# Patient Record
Sex: Male | Born: 1975 | Race: Black or African American | Hispanic: No | Smoking: Never smoker
Health system: Southern US, Community
[De-identification: ages and names within clinical notes are randomized; demographics above are authoritative.]

## PROBLEM LIST (undated history)

## (undated) DIAGNOSIS — F419 Anxiety disorder, unspecified: Secondary | ICD-10-CM

---

## 2008-10-15 ENCOUNTER — Emergency Department (HOSPITAL_COMMUNITY): Admission: EM | Admit: 2008-10-15 | Discharge: 2008-10-15 | Payer: Self-pay | Admitting: Emergency Medicine

## 2009-01-28 ENCOUNTER — Emergency Department (HOSPITAL_COMMUNITY): Admission: EM | Admit: 2009-01-28 | Discharge: 2009-01-28 | Payer: Self-pay | Admitting: Emergency Medicine

## 2009-03-09 ENCOUNTER — Emergency Department (HOSPITAL_COMMUNITY): Admission: EM | Admit: 2009-03-09 | Discharge: 2009-03-09 | Payer: Self-pay | Admitting: Emergency Medicine

## 2009-04-16 ENCOUNTER — Emergency Department (HOSPITAL_COMMUNITY): Admission: EM | Admit: 2009-04-16 | Discharge: 2009-04-16 | Payer: Self-pay | Admitting: Family Medicine

## 2009-08-02 ENCOUNTER — Emergency Department (HOSPITAL_COMMUNITY): Admission: EM | Admit: 2009-08-02 | Discharge: 2009-08-02 | Payer: Self-pay | Admitting: Emergency Medicine

## 2009-10-22 ENCOUNTER — Emergency Department (HOSPITAL_COMMUNITY): Admission: EM | Admit: 2009-10-22 | Discharge: 2009-10-22 | Payer: Self-pay | Admitting: Emergency Medicine

## 2011-03-03 LAB — COMPREHENSIVE METABOLIC PANEL
Albumin: 4.2 g/dL (ref 3.5–5.2)
Alkaline Phosphatase: 61 U/L (ref 39–117)
BUN: 9 mg/dL (ref 6–23)
CO2: 26 mEq/L (ref 19–32)
Chloride: 103 mEq/L (ref 96–112)
Creatinine, Ser: 1.22 mg/dL (ref 0.4–1.5)
GFR calc non Af Amer: >60 mL/min (ref >60)
Glucose, Bld: 81 mg/dL (ref 70–99)
Potassium: 4.6 mEq/L (ref 3.5–5.1)
Total Bilirubin: 0.7 mg/dL (ref 0.3–1.2)

## 2011-03-03 LAB — DIFFERENTIAL
Basophils Absolute: 0 10*3/uL (ref 0.0–0.1)
Basophils Relative: 1 % (ref 0–1)
Monocytes Absolute: 0.3 10*3/uL (ref 0.1–1.0)
Neutro Abs: 1.7 10*3/uL (ref 1.7–7.7)

## 2011-03-03 LAB — URINALYSIS, ROUTINE W REFLEX MICROSCOPIC
Bilirubin Urine: NEGATIVE
Hgb urine dipstick: NEGATIVE
Nitrite: NEGATIVE
Protein, ur: NEGATIVE mg/dL
Urobilinogen, UA: 1 mg/dL (ref 0.0–1.0)

## 2011-03-03 LAB — CBC
HCT: 49.4 % (ref 39.0–52.0)
Hemoglobin: 16.4 g/dL (ref 13.0–17.0)
MCV: 89.2 fL (ref 78.0–100.0)
RBC: 5.54 MIL/uL (ref 4.22–5.81)
WBC: 3.9 10*3/uL — ABNORMAL LOW (ref 4.0–10.5)

## 2011-03-03 LAB — URINE CULTURE

## 2011-03-03 LAB — LIPASE, BLOOD: Lipase: 23 U/L (ref 11–59)

## 2014-01-27 HISTORY — PX: BACK SURGERY: SHX140

## 2015-03-22 ENCOUNTER — Emergency Department (INDEPENDENT_AMBULATORY_CARE_PROVIDER_SITE_OTHER)
Admission: EM | Admit: 2015-03-22 | Discharge: 2015-03-22 | Disposition: A | Discharge status: Nursing Home (Includes ICF) | Payer: Self-pay | Source: Home / Self Care

## 2015-03-22 ENCOUNTER — Emergency Department (HOSPITAL_COMMUNITY): Payer: Self-pay

## 2015-03-22 ENCOUNTER — Emergency Department (HOSPITAL_COMMUNITY)
Admission: EM | Admit: 2015-03-22 | Discharge: 2015-03-22 | Discharge status: Against Medical Advice | Payer: Self-pay | Attending: Emergency Medicine | Admitting: Emergency Medicine

## 2015-03-22 ENCOUNTER — Emergency Department (INDEPENDENT_AMBULATORY_CARE_PROVIDER_SITE_OTHER): Payer: Self-pay

## 2015-03-22 ENCOUNTER — Encounter (HOSPITAL_COMMUNITY): Payer: Self-pay

## 2015-03-22 ENCOUNTER — Encounter (HOSPITAL_COMMUNITY): Payer: Self-pay | Admitting: Emergency Medicine

## 2015-03-22 DIAGNOSIS — S8992XA Unspecified injury of left lower leg, initial encounter: Secondary | ICD-10-CM | POA: Insufficient documentation

## 2015-03-22 DIAGNOSIS — Y9389 Activity, other specified: Secondary | ICD-10-CM | POA: Insufficient documentation

## 2015-03-22 DIAGNOSIS — M25562 Pain in left knee: Secondary | ICD-10-CM

## 2015-03-22 DIAGNOSIS — Y998 Other external cause status: Secondary | ICD-10-CM | POA: Insufficient documentation

## 2015-03-22 DIAGNOSIS — W01198A Fall on same level from slipping, tripping and stumbling with subsequent striking against other object, initial encounter: Secondary | ICD-10-CM | POA: Insufficient documentation

## 2015-03-22 DIAGNOSIS — R600 Localized edema: Secondary | ICD-10-CM

## 2015-03-22 DIAGNOSIS — M79609 Pain in unspecified limb: Secondary | ICD-10-CM

## 2015-03-22 DIAGNOSIS — M7989 Other specified soft tissue disorders: Secondary | ICD-10-CM

## 2015-03-22 DIAGNOSIS — Y9289 Other specified places as the place of occurrence of the external cause: Secondary | ICD-10-CM | POA: Insufficient documentation

## 2015-03-22 DIAGNOSIS — Z880 Allergy status to penicillin: Secondary | ICD-10-CM | POA: Insufficient documentation

## 2015-03-22 MED ORDER — HYDROCODONE-ACETAMINOPHEN 5-325 MG PO TABS
ORAL_TABLET | ORAL | Status: AC
  Filled 2015-03-22: qty 2

## 2015-03-22 MED ORDER — HYDROCODONE-ACETAMINOPHEN 5-325 MG PO TABS
2.0000 | ORAL_TABLET | Freq: Once | ORAL | Status: AC
  Administered 2015-03-22: 2 via ORAL

## 2015-03-22 NOTE — ED Notes (Signed)
Pt is able to ambulate well, with steady gait.

## 2015-03-22 NOTE — ED Provider Notes (Signed)
CSN: [id number]     Arrival date & time 03/22/15  1132 History   None    Chief Complaint  Patient presents with  . Back Pain  . Knee Injury   (Consider location/radiation/quality/duration/timing/severity/associated sxs/prior Treatment)  HPI   Patient is a 39 year old male presenting today with complaints of left knee and back pain. Patient states he had a lumbar laminectomy with stabilization in October of last year in [place]. Patient states he is here visiting his children who live here. Patient states he fell about 2 weeks ago landing on his left side left knee. Patient states he has been "stubborn" and seeking treatment. Patient arrived today ambulating poorly with the assistance of crutches. She reports positive numbness and tingling from the left knee down to the foot in a sock distribution pattern. Reports significant left lower leg weakness and significant left knee pain. States has not taken any medication recently for pain and has used an ace wrap on L knee for comfort.    History reviewed. No pertinent past medical history. History reviewed. No pertinent past surgical history. History reviewed. No pertinent family history. History  Substance Use Topics  . Smoking status: Never Smoker   . Smokeless tobacco: Not on file  . Alcohol Use: No    Review of Systems  Constitutional: Negative for fever and fatigue.  HENT: Negative.   Eyes: Negative.   Respiratory: Negative.  Negative for cough and shortness of breath.   Cardiovascular: Positive for leg swelling. Negative for chest pain and palpitations.  Gastrointestinal: Negative.   Endocrine: Negative.   Genitourinary: Negative.        Denies saddle anesthesia.  Musculoskeletal: Positive for back pain and gait problem. Negative for joint swelling, neck pain and neck stiffness.  Skin: Positive for color change. Negative for wound.  Allergic/Immunologic: Negative.   Neurological: Positive for weakness and numbness.       See  history of present illness.  Hematological: Negative.   Psychiatric/Behavioral: Negative.     Allergies  Penicillins  Home Medications   Prior to Admission medications   Not on File   BP 121/82 mmHg  Pulse 68  Temp(Src) 97.4 F (36.3 C) (Oral)  Resp 16  SpO2 98%   Physical Exam  Constitutional: He is oriented to person, place, and time. He appears well-developed and well-nourished. No distress.  Eyes: Pupils are equal, round, and reactive to light.  Cardiovascular: Normal rate, regular rhythm, normal heart sounds and intact distal pulses.  Exam reveals no gallop and no friction rub.   No murmur heard. 2+ pitting edema of left lower extremity with dusky discoloration of calf/shin/foot. Distal pulses palpable, but cap refill right at 3 seconds. Distal sensation intact to vibration.  Patient refuses DTR check of LLE because of discomfort.  Negative Homan's.  See images.   Pulmonary/Chest: Effort normal and breath sounds normal. No respiratory distress. He has no wheezes. He has no rales. He exhibits no tenderness.  Musculoskeletal: He exhibits edema and tenderness.       Left knee: He exhibits swelling, erythema and bony tenderness. He exhibits normal range of motion, no deformity and no laceration. Tenderness found.  The patient is able to transfer himself to the examination table however has a shuffling, limping gait and utilizing crutches on arrival. Unable to evaluate tandem or heel/toe gait.  Neurological: He is alert and oriented to person, place, and time. No cranial nerve deficit. He exhibits normal muscle tone. Coordination normal.  Nerves II through XII  grossly intact.  Skin: Skin is warm and dry. He is not diaphoretic. There is erythema.  Nursing note and vitals reviewed.   ED Course  Procedures (including critical care time)  Plan of care was discussed with Dr. Denyse Amass.  Xray to r/o fracture.  Labs Review Labs Reviewed - No data to display  Imaging Review Dg  Tibia/fibula Left  03/22/2015   CLINICAL DATA:  Fall 3 days ago.  Leg pain  EXAM: LEFT TIBIA AND FIBULA - 2 VIEW  COMPARISON:  None.  FINDINGS: There is no evidence of fracture or other focal bone lesions. Soft tissues are unremarkable.  IMPRESSION: Negative.   Electronically Signed   By: Marlan Palau M.D.   On: 03/22/2015 14:43           MDM   1. Knee pain, acute, left   2. Lower leg edema    Meds ordered this encounter  Medications  . HYDROcodone-acetaminophen (NORCO/VICODIN) 5-325 MG per tablet 2 tablet    Sig:    Patient to be transferred to [facility] ED for further evaluation. The patient verbalizes understanding and agrees to plan of care.       Servando Salina, NP 03/22/15 1512

## 2015-03-22 NOTE — Progress Notes (Signed)
VASCULAR LAB PRELIMINARY  PRELIMINARY  PRELIMINARY  PRELIMINARY  Left lower extremity venous Doppler completed.    Preliminary report:  There is no DVT, SVT, or Baker's cyst noted in the left lower extremity.  Amika Tassin, RVT 03/22/2015, 6:29 PM

## 2015-03-22 NOTE — ED Notes (Signed)
Pt was sent [facility] for pain to his left knee/leg. It gave out causing him to fall on his left knee and now hurts. They xrayed the leg and it was negative. Was given 2 vicodin and has not helped yet. Pt not sure why he is here. Was not given any follow up or referral information.

## 2015-03-22 NOTE — ED Notes (Addendum)
Pt states that he does not want to wait for his CT scans. Pt states that he will come back tomorrow to get the scans done.
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
## 2015-03-29 ENCOUNTER — Emergency Department (HOSPITAL_COMMUNITY)
Admission: EM | Admit: 2015-03-29 | Discharge: 2015-03-30 | Disposition: A | Discharge status: Home / Self Care | Payer: Self-pay | Attending: Emergency Medicine | Admitting: Emergency Medicine

## 2015-03-29 DIAGNOSIS — R109 Unspecified abdominal pain: Secondary | ICD-10-CM

## 2015-03-29 DIAGNOSIS — Z792 Long term (current) use of antibiotics: Secondary | ICD-10-CM | POA: Insufficient documentation

## 2015-03-29 DIAGNOSIS — N342 Other urethritis: Secondary | ICD-10-CM | POA: Insufficient documentation

## 2015-03-29 DIAGNOSIS — Z880 Allergy status to penicillin: Secondary | ICD-10-CM | POA: Insufficient documentation

## 2015-03-30 ENCOUNTER — Encounter (HOSPITAL_COMMUNITY): Payer: Self-pay | Admitting: Emergency Medicine

## 2015-03-30 ENCOUNTER — Emergency Department (HOSPITAL_COMMUNITY): Payer: Self-pay

## 2015-03-30 LAB — HIV ANTIBODY (ROUTINE TESTING W REFLEX): HIV Screen 4th Generation wRfx: NONREACTIVE

## 2015-03-30 MED ORDER — CIPROFLOXACIN HCL 500 MG PO TABS
500.0000 mg | ORAL_TABLET | Freq: Once | ORAL | Status: AC
  Administered 2015-03-30: 500 mg via ORAL
  Filled 2015-03-30: qty 1

## 2015-03-30 MED ORDER — DOXYCYCLINE HYCLATE 100 MG PO TABS: 100.0000 mg | ORAL_TABLET | Freq: Two times a day (BID) | ORAL | Status: DC

## 2015-03-30 MED ORDER — DOXYCYCLINE HYCLATE 100 MG PO TABS
100.0000 mg | ORAL_TABLET | Freq: Once | ORAL | Status: AC
  Administered 2015-03-30: 100 mg via ORAL
  Filled 2015-03-30: qty 1

## 2015-03-30 MED ORDER — AZITHROMYCIN 250 MG PO TABS
1000.0000 mg | ORAL_TABLET | Freq: Once | ORAL | Status: AC
  Administered 2015-03-30: 1000 mg via ORAL
  Filled 2015-03-30: qty 4

## 2015-03-30 NOTE — ED Notes (Signed)
Pt reports and admits presence of whitish urethral discharges at this time;  Also admits to "burning" on urination at this time which he denied earlier.  Np was made aware of pt's new complaints at this time.

## 2015-03-30 NOTE — ED Notes (Signed)
Pt arrived with a complaint of left sided lower abdominal pain.  Pt states pain has been present for 2 days.  Pt denies testicle pain.  Pt states pain is sharp

## 2015-03-30 NOTE — ED Provider Notes (Signed)
CSN: [id number]     Arrival date & time 03/29/15  2343 History   First MD Initiated Contact with Patient 03/30/15 0249     Chief Complaint  Patient presents with  . Abdominal Pain     (Consider location/radiation/quality/duration/timing/severity/associated sxs/prior Treatment) HPI Comments: Patient reports, that he's had left lower quadrant pain for the past 2 days, which is intermittent in nature.  He also is having penile shaft pain after several hours in the emergency department.  He then admits that he is having white discharge from his penis for the past 2 days and has a new sexual partner.  Patient is a 39 y.o. male presenting with abdominal pain. The history is provided by the patient.  Abdominal Pain Pain location:  LLQ Pain quality: aching   Pain severity:  Moderate Onset quality:  Sudden Timing:  Intermittent Progression:  Worsening Chronicity:  New Relieved by:  None tried Worsened by:  Nothing tried Associated symptoms: dysuria   Associated symptoms: no constipation, no diarrhea, no hematuria and no shortness of breath     History reviewed. No pertinent past medical history. Past Surgical History  Procedure Laterality Date  . Back surgery  01/2014    Pins and a rod   History reviewed. No pertinent family history. History  Substance Use Topics  . Smoking status: Never Smoker   . Smokeless tobacco: Not on file  . Alcohol Use: No    Review of Systems  Respiratory: Negative for shortness of breath.   Gastrointestinal: Positive for abdominal pain. Negative for diarrhea and constipation.  Genitourinary: Positive for dysuria, penile pain and testicular pain. Negative for hematuria and genital sores.  All other systems reviewed and are negative.     Allergies  Penicillins  Home Medications   Prior to Admission medications   Medication Sig Start Date End Date Taking? Authorizing Provider  doxycycline (VIBRA-TABS) 100 MG tablet Take 1 tablet (100 mg total)  by mouth 2 (two) times daily. 03/30/15   Earley Favor, NP   BP 138/76 mmHg  Pulse 87  Temp(Src) 98.2 F (36.8 C) (Oral)  Resp 18  SpO2 98% Physical Exam  Constitutional: He appears well-developed and well-nourished.  HENT:  Head: Normocephalic.  Eyes: Pupils are equal, round, and reactive to light.  Neck: Normal range of motion.  Cardiovascular: Normal rate.   Pulmonary/Chest: Effort normal.  Abdominal: Soft. He exhibits no distension. There is tenderness. Hernia confirmed negative in the left inguinal area.  Genitourinary: Left testis shows tenderness. Left testis shows no swelling. Left testis is descended. Cremasteric reflex is not absent on the left side. Circumcised. Penile tenderness present.  Musculoskeletal: Normal range of motion.  Lymphadenopathy:       Left: No inguinal adenopathy present.  Neurological: He is alert.  Skin: Skin is warm.  Nursing note and vitals reviewed.   ED Course  Procedures (including critical care time) Labs Review Labs Reviewed  HIV ANTIBODY (ROUTINE TESTING)  GC/CHLAMYDIA PROBE AMP ([facility])    Imaging Review Ct Renal Stone Study  03/30/2015   CLINICAL DATA:  Acute onset of left lower quadrant abdominal pain. Initial encounter.  EXAM: CT ABDOMEN AND PELVIS WITHOUT CONTRAST  TECHNIQUE: Multidetector CT imaging of the abdomen and pelvis was performed following the standard protocol without IV contrast.  COMPARISON:  CT of the abdomen and pelvis performed 10/22/2009  FINDINGS: The visualized lung bases are clear.  A 1.6 cm cyst is noted along the anterior right hepatic lobe; this has increased only  mildly in size from 2010. The gallbladder is within normal limits. The pancreas and adrenal glands are unremarkable.  The kidneys are unremarkable in appearance. There is no evidence of hydronephrosis. No renal or ureteral stones are seen. No perinephric stranding is appreciated.  No free fluid is identified. The small bowel is unremarkable in  appearance. The stomach is within normal limits. No acute vascular abnormalities are seen.  The appendix is normal in caliber and contains air, without evidence of appendicitis. The sigmoid colon is mildly redundant. The colon is unremarkable in appearance.  The bladder is mildly distended and grossly unremarkable. A small urachal remnant is incidentally seen. The prostate remains normal in size. No inguinal lymphadenopathy is seen.  No acute osseous abnormalities are identified. Mild degenerative change is noted at the acetabula, slightly more prominent on the right, with superior joint space narrowing noted bilaterally. The patient is status post anterior and posterior lumbar spinal fusion at L5-S1. A few associated clips are noted anteriorly at this level.  IMPRESSION: 1. No acute abnormality seen within the abdomen or pelvis. 2. Hepatic cyst again noted.   Electronically Signed   By: Roanna RaiderJeffery  Chang M.D.   On: 03/30/2015 05:40     EKG Interpretation None     gallstone.  Study is negative with the appearance of urethral discharge.  She will be treated for STD "urethritis" with Cipro, doxycycline and azithromycin due to his penicillin allergy  MDM   Final diagnoses:  Flank pain  Urethritis         Earley FavorGail Shaquon Gropp, NP 03/30/15 [phone]  Mirian MoMatthew Gentry, MD 03/30/15 [phone]

## 2015-03-31 LAB — GC/CHLAMYDIA PROBE AMP (~~LOC~~) NOT AT ARMC
Chlamydia: NEGATIVE
Neisseria Gonorrhea: POSITIVE — AB

## 2015-04-01 ENCOUNTER — Telehealth (HOSPITAL_COMMUNITY): Payer: Self-pay

## 2015-04-01 NOTE — ED Notes (Signed)
Positive for gonorrhea. Chart sent to edp office for review 

## 2015-04-02 ENCOUNTER — Telehealth (HOSPITAL_BASED_OUTPATIENT_CLINIC_OR_DEPARTMENT_OTHER): Payer: Self-pay | Admitting: Emergency Medicine

## 2015-04-03 ENCOUNTER — Telehealth: Payer: Self-pay | Admitting: Emergency Medicine

## 2015-04-04 ENCOUNTER — Telehealth (HOSPITAL_BASED_OUTPATIENT_CLINIC_OR_DEPARTMENT_OTHER): Payer: Self-pay | Admitting: Emergency Medicine

## 2015-06-02 ENCOUNTER — Telehealth (HOSPITAL_COMMUNITY): Payer: Self-pay

## 2015-06-02 NOTE — ED Notes (Signed)
Unable to contact pt by mail or telephone. Unable to communicate lab results or treatment changes. 

## 2015-06-07 ENCOUNTER — Emergency Department (HOSPITAL_COMMUNITY)
Admission: EM | Admit: 2015-06-07 | Discharge: 2015-06-07 | Disposition: A | Discharge status: Home / Self Care | Payer: Self-pay | Attending: Emergency Medicine | Admitting: Emergency Medicine

## 2015-06-07 ENCOUNTER — Encounter (HOSPITAL_COMMUNITY): Payer: Self-pay | Admitting: Emergency Medicine

## 2015-06-07 ENCOUNTER — Emergency Department (HOSPITAL_COMMUNITY): Payer: Self-pay

## 2015-06-07 DIAGNOSIS — S8992XA Unspecified injury of left lower leg, initial encounter: Secondary | ICD-10-CM | POA: Insufficient documentation

## 2015-06-07 DIAGNOSIS — Y998 Other external cause status: Secondary | ICD-10-CM | POA: Insufficient documentation

## 2015-06-07 DIAGNOSIS — M25562 Pain in left knee: Secondary | ICD-10-CM

## 2015-06-07 DIAGNOSIS — Z880 Allergy status to penicillin: Secondary | ICD-10-CM | POA: Insufficient documentation

## 2015-06-07 DIAGNOSIS — M79645 Pain in left finger(s): Secondary | ICD-10-CM

## 2015-06-07 DIAGNOSIS — Y9389 Activity, other specified: Secondary | ICD-10-CM | POA: Insufficient documentation

## 2015-06-07 DIAGNOSIS — S6992XA Unspecified injury of left wrist, hand and finger(s), initial encounter: Secondary | ICD-10-CM | POA: Insufficient documentation

## 2015-06-07 DIAGNOSIS — W108XXA Fall (on) (from) other stairs and steps, initial encounter: Secondary | ICD-10-CM | POA: Insufficient documentation

## 2015-06-07 DIAGNOSIS — Y9289 Other specified places as the place of occurrence of the external cause: Secondary | ICD-10-CM | POA: Insufficient documentation

## 2015-06-07 DIAGNOSIS — Z792 Long term (current) use of antibiotics: Secondary | ICD-10-CM | POA: Insufficient documentation

## 2015-06-07 MED ORDER — ACETAMINOPHEN 325 MG PO TABS
650.0000 mg | ORAL_TABLET | Freq: Once | ORAL | Status: AC
  Administered 2015-06-07: 650 mg via ORAL
  Filled 2015-06-07: qty 2

## 2015-06-07 MED ORDER — NAPROXEN 250 MG PO TABS: 250.0000 mg | ORAL_TABLET | Freq: Two times a day (BID) | ORAL | Status: DC

## 2015-06-07 NOTE — ED Notes (Signed)
Pt fell down 2 steps last pm. C/o left knee and left middle finger pain. Swelling noted to both.

## 2015-06-07 NOTE — Discharge Instructions (Signed)
Knee Pain °The knee is the complex joint between your thigh and your lower leg. It is made up of bones, tendons, ligaments, and cartilage. The bones that make up the knee are: °· The femur in the thigh. °· The tibia and fibula in the lower leg. °· The patella or kneecap riding in the groove on the lower femur. °CAUSES  °Knee pain is a common complaint with many causes. A few of these causes are: °· Injury, such as: °¨ A ruptured ligament or tendon injury. °¨ Torn cartilage. °· Medical conditions, such as: °¨ Gout °¨ Arthritis °¨ Infections °· Overuse, over training, or overdoing a physical activity. °Knee pain can be minor or severe. Knee pain can accompany debilitating injury. Minor knee problems often respond well to self-care measures or get well on their own. More serious injuries may need medical intervention or even surgery. °SYMPTOMS °The knee is complex. Symptoms of knee problems can vary widely. Some of the problems are: °· Pain with movement and weight bearing. °· Swelling and tenderness. °· Buckling of the knee. °· Inability to straighten or extend your knee. °· Your knee locks and you cannot straighten it. °· Warmth and redness with pain and fever. °· Deformity or dislocation of the kneecap. °DIAGNOSIS  °Determining what is wrong may be very straight forward such as when there is an injury. It can also be challenging because of the complexity of the knee. Tests to make a diagnosis may include: °· Your caregiver taking a history and doing a physical exam. °· Routine X-rays can be used to rule out other problems. X-rays will not reveal a cartilage tear. Some injuries of the knee can be diagnosed by: °· Arthroscopy a surgical technique by which a small video camera is inserted through tiny incisions on the sides of the knee. This procedure is used to examine and repair internal knee joint problems. Tiny instruments can be used during arthroscopy to repair the torn knee cartilage (meniscus). °· Arthrography  is a radiology technique. A contrast liquid is directly injected into the knee joint. Internal structures of the knee joint then become visible on X-ray film. °· An MRI scan is a non X-ray radiology procedure in which magnetic fields and a computer produce two- or three-dimensional images of the inside of the knee. Cartilage tears are often visible using an MRI scanner. MRI scans have largely replaced arthrography in diagnosing cartilage tears of the knee. °· Blood work. °· Examination of the fluid that helps to lubricate the knee joint (synovial fluid). This is done by taking a sample out using a needle and a syringe. °TREATMENT °The treatment of knee problems depends on the cause. Some of these treatments are: °· Depending on the injury, proper casting, splinting, surgery, or physical therapy care will be needed. °· Give yourself adequate recovery time. Do not overuse your joints. If you begin to get sore during workout routines, back off. Slow down or do fewer repetitions. °· For repetitive activities such as cycling or running, maintain your strength and nutrition. °· Alternate muscle groups. For example, if you are a weight lifter, work the upper body on one day and the lower body the next. °· Either tight or weak muscles do not give the proper support for your knee. Tight or weak muscles do not absorb the stress placed on the knee joint. Keep the muscles surrounding the knee strong. °· Take care of mechanical problems. °· If you have flat feet, orthotics or special shoes may help.   See your caregiver if you need help. °· Arch supports, sometimes with wedges on the inner or outer aspect of the heel, can help. These can shift pressure away from the side of the knee most bothered by osteoarthritis. °· A brace called an "unloader" brace also may be used to help ease the pressure on the most arthritic side of the knee. °· If your caregiver has prescribed crutches, braces, wraps or ice, use as directed. The acronym  for this is PRICE. This means protection, rest, ice, compression, and elevation. °· Nonsteroidal anti-inflammatory drugs (NSAIDs), can help relieve pain. But if taken immediately after an injury, they may actually increase swelling. Take NSAIDs with food in your stomach. Stop them if you develop stomach problems. Do not take these if you have a history of ulcers, stomach pain, or bleeding from the bowel. Do not take without your caregiver's approval if you have problems with fluid retention, heart failure, or kidney problems. °· For ongoing knee problems, physical therapy may be helpful. °· Glucosamine and chondroitin are over-the-counter dietary supplements. Both may help relieve the pain of osteoarthritis in the knee. These medicines are different from the usual anti-inflammatory drugs. Glucosamine may decrease the rate of cartilage destruction. °· Injections of a corticosteroid drug into your knee joint may help reduce the symptoms of an arthritis flare-up. They may provide pain relief that lasts a few months. You may have to wait a few months between injections. The injections do have a small increased risk of infection, water retention, and elevated blood sugar levels. °· Hyaluronic acid injected into damaged joints may ease pain and provide lubrication. These injections may work by reducing inflammation. A series of shots may give relief for as long as 6 months. °· Topical painkillers. Applying certain ointments to your skin may help relieve the pain and stiffness of osteoarthritis. Ask your pharmacist for suggestions. Many over the-counter products are approved for temporary relief of arthritis pain. °· In some countries, doctors often prescribe topical NSAIDs for relief of chronic conditions such as arthritis and tendinitis. A review of treatment with NSAID creams found that they worked as well as oral medications but without the serious side effects. °PREVENTION °· Maintain a healthy weight. Extra pounds  put more strain on your joints. °· Get strong, stay limber. Weak muscles are a common cause of knee injuries. Stretching is important. Include flexibility exercises in your workouts. °· Be smart about exercise. If you have osteoarthritis, chronic knee pain or recurring injuries, you may need to change the way you exercise. This does not mean you have to stop being active. If your knees ache after jogging or playing basketball, consider switching to swimming, water aerobics, or other low-impact activities, at least for a few days a week. Sometimes limiting high-impact activities will provide relief. °· Make sure your shoes fit well. Choose footwear that is right for your sport. °· Protect your knees. Use the proper gear for knee-sensitive activities. Use kneepads when playing volleyball or laying carpet. Buckle your seat belt every time you drive. Most shattered kneecaps occur in car accidents. °· Rest when you are tired. °SEEK MEDICAL CARE IF:  °You have knee pain that is continual and does not seem to be getting better.  °SEEK IMMEDIATE MEDICAL CARE IF:  °Your knee joint feels hot to the touch and you have a high fever. °MAKE SURE YOU:  °· Understand these instructions. °· Will watch your condition. °· Will get help right away if you are not   doing well or get worse. °Document Released: 09/12/2007 Document Revised: 02/07/2012 Document Reviewed: 09/12/2007 °[employer]® Patient Information ©2015 [employer]. This information is not intended to replace advice given to you by your health care provider. Make sure you discuss any questions you have with your health care provider. ° °Knee Bracing °Knee braces are supports to help stabilize and protect an injured or painful knee. They come in many different styles. They should support and protect the knee without increasing the chance of other injuries to yourself or others. It is important not to have a false sense of security when using a brace. Knee braces that help you  to keep using your knee: °· Do not restore normal knee stability under high stress forces. °· May decrease some aspects of athletic performance. °Some of the different types of knee braces are: °· Prophylactic knee braces are designed to prevent or reduce the severity of knee injuries during sports that make injury to the knee more likely. °· Rehabilitative knee braces are designed to allow protected motion of: °¨ Injured knees. °¨ Knees that have been treated with or without surgery. °There is no evidence that the use of a supportive knee brace protects the graft following a successful anterior cruciate ligament (ACL) reconstruction. However, braces are sometimes used to:  °· Protect injured ligaments. °· Control knee movement during the initial healing period. °They may be used as part of the treatment program for the various injured ligaments or cartilage of the knee including the: °· Anterior cruciate ligament. °· Medial collateral ligament. °· Medial or lateral cartilage (meniscus). °· Posterior cruciate ligament. °· Lateral collateral ligament. °Rehabilitative knee braces are most commonly used: °· During crutch-assisted walking right after injury. °· During crutch-assisted walking right after surgery to repair the cartilage and/or cruciate ligament injury. °· For a short period of time, 2-8 weeks, after the injury or surgery. °The value of a rehabilitative brace as opposed to a cast or splint includes the: °· Ability to adjust the brace for swelling. °· Ability to remove the brace for examinations, icing, or showering. °· Ability to allow for movement in a controlled range of motion. °Functional knee braces give support to knees that have already been injured. They are designed to provide stability for the injured knee and provide protection after repair. Functional knee braces may not affect performance much. Lower extremity muscle strengthening, flexibility, and improvement in technique are more important  than bracing in treating ligamentous knee injuries. Functional braces are not a substitute for rehabilitation or surgical procedures. °Unloader/off-loader braces are designed to provide pain relief in arthritic knees. Patients with wear and tear arthritis from growing old or from an old cartilage injury (osteoarthritis) of the knee, and bowlegged (varus) or knock-knee (valgus) deformities, often develop increased pain in the arthritic side due to increased loading. Unloader/off-loader braces are made to reduce uneven loading in such knees. There is reduction in bowing out movement in bowlegged knees when the correct unloader brace is used. Patients with advanced osteoarthritis or severe varus or valgus alignment problems would not likely benefit from bracing. °Patellofemoral braces help the kneecap to move smoothly and well centered over the end of the femur in the knee.  °Most people who wear knee braces feel that they help. However, there is a lack of scientific evidence that knee braces are helpful at the level needed for athletic participation to prevent injury. In spite of this, athletes report an increase in knee stability, pain relief, performance improvement, and confidence   during athletics when using a brace.  °Different knee problems require different knee braces: °· Your caregiver may suggest one kind of knee brace after knee surgery. °· A caregiver may choose another kind of knee brace for support instead of surgery for some types of torn ligaments. °· You may also need one for pain in the front of your knee that is not getting better with strengthening and flexibility exercises. °Get your caregiver's advice if you want to try a knee brace. The caregiver will advise you on where to get them and provide a prescription when it is needed to fashion and/or fit the brace. °Knee braces are the least important part of preventing knee injuries or getting better following injury. Stretching, strengthening and  technique improvement are far more important in caring for and preventing knee injuries. When strengthening your knee, increase your activities a little at a time so as not to develop injuries from overuse. Work out an exercise plan with your caregiver and/or physical therapist to get the best program for you. Do not let a knee brace become a crutch. °Always remember, there are no braces which support the knee as well as your original ligaments and cartilage you were born with. Conditioning, proper warm-up, and stretching remain the most important parts of keeping your knees healthy. °HOW TO USE A KNEE BRACE °· During sports, knee braces should be used as directed by your caregiver. °· Make sure that the hinges are where the knee bends. °· Straps, tapes, or hook-and-loop tapes should be fastened around your leg as instructed. °· You should check the placement of the brace during activities to make sure that it has not moved. Poorly positioned braces can hurt rather than help you. °· To work well, a knee brace should be worn during all activities that put you at risk of knee injury. °· Warm up properly before beginning athletic activities. °HOME CARE INSTRUCTIONS °· Knee braces often get damaged during normal use. Replace worn-out braces for maximum benefit. °· Clean regularly with soap and water. °· Inspect your brace often for wear and tear. °· Cover exposed metal to protect others from injury. °· Durable materials may cost more, but last longer. °SEEK IMMEDIATE MEDICAL CARE IF:  °· Your knee seems to be getting worse rather than better. °· You have increasing pain or swelling in the knee. °· You have problems caused by the knee brace. °· You have increased swelling or inflammation (redness or soreness) in your knee. °· Your knee becomes warm and more painful and you develop an unexplained temperature over 101°F (38.3°C). °MAKE SURE YOU:  °· Understand these instructions. °· Will watch your condition. °· Will get  help right away if you are not doing well or get worse. °See your caregiver, physical therapist, or orthopedic surgeon for additional information. °Document Released: 02/05/2004 Document Revised: 04/01/2014 Document Reviewed: 05/14/2009 °Exit[employer]® Patient Information ©2015 [employer]. This information is not intended to replace advice given to you by your health care provider. Make sure you discuss any questions you have with your health care provider. ° °
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
## 2015-06-07 NOTE — ED Notes (Signed)
Pt. Stated, i fell from steps, I missed the last step, c/o knee pain and my left middle finger.

## 2015-11-14 ENCOUNTER — Emergency Department (HOSPITAL_COMMUNITY): Payer: Self-pay

## 2015-11-14 ENCOUNTER — Encounter (HOSPITAL_COMMUNITY): Payer: Self-pay | Admitting: *Deleted

## 2015-11-14 ENCOUNTER — Emergency Department (HOSPITAL_COMMUNITY)
Admission: EM | Admit: 2015-11-14 | Discharge: 2015-11-14 | Disposition: A | Discharge status: Home / Self Care | Payer: Self-pay | Attending: Emergency Medicine | Admitting: Emergency Medicine

## 2015-11-14 DIAGNOSIS — Y998 Other external cause status: Secondary | ICD-10-CM | POA: Insufficient documentation

## 2015-11-14 DIAGNOSIS — Z880 Allergy status to penicillin: Secondary | ICD-10-CM | POA: Insufficient documentation

## 2015-11-14 DIAGNOSIS — X58XXXA Exposure to other specified factors, initial encounter: Secondary | ICD-10-CM | POA: Insufficient documentation

## 2015-11-14 DIAGNOSIS — Z791 Long term (current) use of non-steroidal anti-inflammatories (NSAID): Secondary | ICD-10-CM | POA: Insufficient documentation

## 2015-11-14 DIAGNOSIS — Y92310 Basketball court as the place of occurrence of the external cause: Secondary | ICD-10-CM | POA: Insufficient documentation

## 2015-11-14 DIAGNOSIS — M25462 Effusion, left knee: Secondary | ICD-10-CM | POA: Insufficient documentation

## 2015-11-14 DIAGNOSIS — Z792 Long term (current) use of antibiotics: Secondary | ICD-10-CM | POA: Insufficient documentation

## 2015-11-14 DIAGNOSIS — Y9367 Activity, basketball: Secondary | ICD-10-CM | POA: Insufficient documentation

## 2015-11-14 MED ORDER — HYDROCODONE-ACETAMINOPHEN 5-325 MG PO TABS
1.0000 | ORAL_TABLET | Freq: Once | ORAL | Status: AC
  Administered 2015-11-14: 1 via ORAL
  Filled 2015-11-14: qty 1
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
## 2015-11-14 NOTE — Discharge Instructions (Signed)
Knee Effusion °Knee effusion means that you have extra fluid in your knee. This can cause pain. Your knee may be more difficult to bend and move. °HOME CARE °· Use crutches as told by your doctor. °· Wear a knee brace as told by your doctor. °· Apply ice to the swollen area: °¨ Put ice in a plastic bag. °¨ Place a towel between your skin and the bag. °¨ Leave the ice on for 20 minutes, 2-3 times per day. °· Keep your knee raised (elevated) when you are sitting or lying down. °· Take medicines only as told by your doctor. °· Do any rehabilitation or strengthening exercises as told by your doctor. °· Rest your knee as told by your doctor. You may start doing your normal activities again when your doctor says it is okay. °· Keep all follow-up visits as told by your doctor. This is important. °GET HELP IF:  °· You continue to have pain in your knee. °GET HELP RIGHT AWAY IF: °· You have increased swelling or redness of your knee. °· You have severe pain in your knee. °· You have a fever. °  °This information is not intended to replace advice given to you by your health care provider. Make sure you discuss any questions you have with your health care provider. °  °Document Released: 12/18/2010 Document Revised: 12/06/2014 Document Reviewed: 07/01/2014 °Elsevier Interactive Patient Education ©2016 Else[employer] Inc[employer] ° °

## 2015-11-14 NOTE — ED Notes (Signed)
Pt reports injury to his left knee last night while playing basket ball. Pt reports swelling and pain.

## 2015-12-27 ENCOUNTER — Emergency Department (INDEPENDENT_AMBULATORY_CARE_PROVIDER_SITE_OTHER)
Admission: EM | Admit: 2015-12-27 | Discharge: 2015-12-27 | Disposition: A | Discharge status: Home / Self Care | Payer: Self-pay | Source: Home / Self Care | Attending: Family Medicine | Admitting: Family Medicine

## 2015-12-27 ENCOUNTER — Encounter (HOSPITAL_COMMUNITY): Payer: Self-pay | Admitting: Emergency Medicine

## 2015-12-27 DIAGNOSIS — G8929 Other chronic pain: Secondary | ICD-10-CM

## 2015-12-27 DIAGNOSIS — M549 Dorsalgia, unspecified: Secondary | ICD-10-CM

## 2015-12-27 MED ORDER — CYCLOBENZAPRINE HCL 5 MG PO TABS: 5.0000 mg | ORAL_TABLET | Freq: Three times a day (TID) | ORAL | Status: DC | PRN

## 2015-12-27 MED ORDER — DICLOFENAC POTASSIUM 50 MG PO TABS: 50.0000 mg | ORAL_TABLET | Freq: Three times a day (TID) | ORAL | Status: DC
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
## 2015-12-27 NOTE — Discharge Instructions (Signed)
Use medicine and see orthopedist if further problems.

## 2016-01-31 ENCOUNTER — Encounter (HOSPITAL_COMMUNITY): Payer: Self-pay | Admitting: Emergency Medicine

## 2016-01-31 ENCOUNTER — Emergency Department (HOSPITAL_COMMUNITY)
Admission: EM | Admit: 2016-01-31 | Discharge: 2016-01-31 | Disposition: A | Discharge status: Home / Self Care | Payer: Self-pay | Attending: Emergency Medicine | Admitting: Emergency Medicine

## 2016-01-31 DIAGNOSIS — M549 Dorsalgia, unspecified: Secondary | ICD-10-CM | POA: Insufficient documentation

## 2016-01-31 DIAGNOSIS — Z880 Allergy status to penicillin: Secondary | ICD-10-CM | POA: Insufficient documentation

## 2016-01-31 DIAGNOSIS — Z792 Long term (current) use of antibiotics: Secondary | ICD-10-CM | POA: Insufficient documentation

## 2016-01-31 DIAGNOSIS — Z791 Long term (current) use of non-steroidal anti-inflammatories (NSAID): Secondary | ICD-10-CM | POA: Insufficient documentation

## 2016-01-31 LAB — URINALYSIS, ROUTINE W REFLEX MICROSCOPIC
Bilirubin Urine: NEGATIVE
GLUCOSE, UA: NEGATIVE mg/dL
Hgb urine dipstick: NEGATIVE
KETONES UR: NEGATIVE mg/dL
Leukocytes, UA: NEGATIVE
Nitrite: NEGATIVE
Protein, ur: NEGATIVE mg/dL
SPECIFIC GRAVITY, URINE: 1.034 — AB (ref 1.005–1.030)
pH: 6 (ref 5.0–8.0)

## 2016-01-31 MED ORDER — KETOROLAC TROMETHAMINE 30 MG/ML IJ SOLN
30.0000 mg | Freq: Once | INTRAMUSCULAR | Status: DC
  Filled 2016-01-31: qty 1

## 2016-01-31 MED ORDER — KETOROLAC TROMETHAMINE 30 MG/ML IJ SOLN: 30.0000 mg | Freq: Once | INTRAMUSCULAR | Status: DC

## 2016-01-31 MED ORDER — IBUPROFEN 400 MG PO TABS: 400.0000 mg | ORAL_TABLET | Freq: Four times a day (QID) | ORAL | Status: DC | PRN

## 2016-01-31 MED ORDER — IBUPROFEN 400 MG PO TABS
800.0000 mg | ORAL_TABLET | Freq: Once | ORAL | Status: AC
  Administered 2016-01-31: 800 mg via ORAL
  Filled 2016-01-31: qty 2

## 2016-01-31 MED ORDER — METHOCARBAMOL 500 MG PO TABS: 500.0000 mg | ORAL_TABLET | Freq: Two times a day (BID) | ORAL | Status: DC

## 2016-01-31 NOTE — ED Notes (Signed)
Pt given sprite upon request  

## 2016-01-31 NOTE — ED Notes (Signed)
Pt given water with ibuprofen.

## 2016-01-31 NOTE — ED Notes (Signed)
Pt c/o low back pain x 2 days. Pt has hx of herniated disc and sciatica.

## 2016-01-31 NOTE — ED Notes (Signed)
See PA assessment 

## 2016-01-31 NOTE — ED Provider Notes (Signed)
CSN: [id number]     Arrival date & time 01/31/16  1306 History   First MD Initiated Contact with Patient 01/31/16 1335     Chief Complaint  Patient presents with  . Back Pain   HPI  James Sexton is a 40 y.o. male PMH significant for herniated disc and sciatica (diagnosed 2 years ago per patient) presenting with a 2 day history of acute on chronic back pain. He describes his pain as left sided and midline lower back, radiating down his left leg, worse with movement/palpation, 10/10 pain scale. He denies fevers, chills, CP, SOB, abdominal pain, changes in bowel/bladder habits, cancer history, night sweats, IVDU.   History reviewed. No pertinent past medical history. Past Surgical History  Procedure Laterality Date  . Back surgery  01/2014    Pins and a rod   History reviewed. No pertinent family history. Social History  Substance Use Topics  . Smoking status: Never Smoker   . Smokeless tobacco: None  . Alcohol Use: No    Review of Systems  Ten systems are reviewed and are negative for acute change except as noted in the HPI  Allergies  Penicillins  Home Medications   Prior to Admission medications   Medication Sig Start Date End Date Taking? Authorizing Provider  cyclobenzaprine (FLEXERIL) 5 MG tablet Take 1 tablet (5 mg total) by mouth 3 (three) times daily as needed for muscle spasms. 12/27/15   Linna HoffJames D Kindl, MD  diclofenac (CATAFLAM) 50 MG tablet Take 1 tablet (50 mg total) by mouth 3 (three) times daily. For pain 12/27/15   Linna HoffJames D Kindl, MD  doxycycline (VIBRA-TABS) 100 MG tablet Take 1 tablet (100 mg total) by mouth 2 (two) times daily. 03/30/15   Earley FavorGail Schulz, NP  naproxen (NAPROSYN) 250 MG tablet Take 1 tablet (250 mg total) by mouth 2 (two) times daily with a meal. 06/07/15   Everlene FarrierWilliam Dansie, PA-C   BP 116/58 mmHg  Pulse 69  Temp(Src) 98.4 F (36.9 C) (Oral)  Resp 18  Ht 5\' 8"  (1.727 m)  Wt 79.742 kg  BMI 26.74 kg/m2  SpO2 100% Physical Exam  Constitutional: He  appears well-developed and well-nourished. No distress.  HENT:  Head: Normocephalic and atraumatic.  Mouth/Throat: Oropharynx is clear and moist. No oropharyngeal exudate.  Eyes: Conjunctivae are normal. Pupils are equal, round, and reactive to light. Right eye exhibits no discharge. Left eye exhibits no discharge. No scleral icterus.  Neck: No tracheal deviation present.  Cardiovascular: Normal rate, regular rhythm, normal heart sounds and intact distal pulses.  Exam reveals no gallop and no friction rub.   No murmur heard. Pulmonary/Chest: Effort normal and breath sounds normal. No respiratory distress. He has no wheezes. He has no rales. He exhibits no tenderness.  Abdominal: Soft. Bowel sounds are normal. He exhibits no distension and no mass. There is no tenderness. There is no rebound and no guarding.  Musculoskeletal: He exhibits tenderness. He exhibits no edema.       Arms: Lymphadenopathy:    He has no cervical adenopathy.  Neurological: He is alert. Coordination normal.  Skin: Skin is warm and dry. No rash noted. He is not diaphoretic. No erythema.  Psychiatric: He has a normal mood and affect. His behavior is normal.  Nursing note and vitals reviewed.   ED Course  Procedures  Labs Review Labs Reviewed  URINALYSIS, ROUTINE W REFLEX MICROSCOPIC (NOT [facility])    MDM   Final diagnoses:  Left-sided back pain, unspecified location  Patient with back pain.  This is a chronic issue- patient recently seen at PCP, given muscle relaxer and NSAIDS- patient is out of this medication. No imaging is needed. Will obtain UA. No neurological deficits and normal neuro exam.  Patient can walk but states is painful.  No loss of bowel or bladder control.  No concern for cauda equina.  No fever, night sweats, weight loss, h/o cancer, IVDU.  RICE protocol and pain medicine indicated and discussed with patient. UA unremarkable. Patient may be safely discharged home. Discussed reasons for return.  Patient to follow-up with primary care provider within one week. Patient in understanding and agreement with the plan.    Melton Krebs, PA-C 02/05/16 1610  Mancel Bale, MD 02/08/16 2124

## 2016-01-31 NOTE — ED Notes (Signed)
Pt given another sprite.

## 2016-01-31 NOTE — ED Notes (Signed)
Pt given a third cup of water per request- pt sts he still cannot give urine specimen at this time.

## 2016-01-31 NOTE — Discharge Instructions (Signed)
[name],  Nice meeting you! Please follow-up with your primary care provider. Return to the emergency department if you develop loss of bladder/bowel control, fevers, chills. Feel better soon!  S. James HackerNicole Raziyah Vanvleck, PA-C   Back Pain, Adult Back pain is very common in adults.The cause of back pain is rarely dangerous and the pain often gets better over time.The cause of your back pain may not be known. Some common causes of back pain include: 1. Strain of the muscles or ligaments supporting the spine. 2. Wear and tear (degeneration) of the spinal disks. 3. Arthritis. 4. Direct injury to the back. For many people, back pain may return. Since back pain is rarely dangerous, most people can learn to manage this condition on their own. HOME CARE INSTRUCTIONS Watch your back pain for any changes. The following actions may help to lessen any discomfort you are feeling: 1. Remain active. It is stressful on your back to sit or stand in one place for long periods of time. Do not sit, drive, or stand in one place for more than 30 minutes at a time. Take short walks on even surfaces as soon as you are able.Try to increase the length of time you walk each day. 2. Exercise regularly as directed by your health care provider. Exercise helps your back heal faster. It also helps avoid future injury by keeping your muscles strong and flexible. 3. Do not stay in bed.Resting more than 1-2 days can delay your recovery. 4. Pay attention to your body when you bend and lift. The most comfortable positions are those that put less stress on your recovering back. Always use proper lifting techniques, including: 1. Bending your knees. 2. Keeping the load close to your body. 3. Avoiding twisting. 5. Find a comfortable position to sleep. Use a firm mattress and lie on your side with your knees slightly bent. If you lie on your back, put a pillow under your knees. 6. Avoid feeling anxious or stressed.Stress  increases muscle tension and can worsen back pain.It is important to recognize when you are anxious or stressed and learn ways to manage it, such as with exercise. 7. Take medicines only as directed by your health care provider. Over-the-counter medicines to reduce pain and inflammation are often the most helpful.Your health care provider may prescribe muscle relaxant drugs.These medicines help dull your pain so you can more quickly return to your normal activities and healthy exercise. 8. Apply ice to the injured area: 1. Put ice in a plastic bag. 2. Place a towel between your skin and the bag. 3. Leave the ice on for 20 minutes, 2-3 times a day for the first 2-3 days. After that, ice and heat may be alternated to reduce pain and spasms. 9. Maintain a healthy weight. Excess weight puts extra stress on your back and makes it difficult to maintain good posture. SEEK MEDICAL CARE IF: 1. You have pain that is not relieved with rest or medicine. 2. You have increasing pain going down into the legs or buttocks. 3. You have pain that does not improve in one week. 4. You have night pain. 5. You lose weight. 6. You have a fever or chills. SEEK IMMEDIATE MEDICAL CARE IF:  1. You develop new bowel or bladder control problems. 2. You have unusual weakness or numbness in your arms or legs. 3. You develop nausea or vomiting. 4. You develop abdominal pain. 5. You feel faint.   This information is not intended to replace  advice given to you by your health care provider. Make sure you discuss any questions you have with your health care provider.   Document Released: 11/15/2005 Document Revised: 12/06/2014 Document Reviewed: 03/19/2014 Elsevier Interactive Patient Education 2016 Else[employer] Inc[employer]  Back Exercises The following exercises strengthen the muscles that help to support the back. They also help to keep the lower back flexible. Doing these exercises can help to prevent back pain or lessen existing  pain. If you have back pain or discomfort, try doing these exercises 2-3 times each day or as told by your health care provider. When the pain goes away, do them once each day, but increase the number of times that you repeat the steps for each exercise (do more repetitions). If you do not have back pain or discomfort, do these exercises once each day or as told by your health care provider. EXERCISES Single Knee to Chest Repeat these steps 3-5 times for each leg: 5. Lie on your back on a firm bed or the floor with your legs extended. 6. Bring one knee to your chest. Your other leg should stay extended and in contact with the floor. 7. Hold your knee in place by grabbing your knee or thigh. 8. Pull on your knee until you feel a gentle stretch in your lower back. 9. Hold the stretch for 10-30 seconds. 10. Slowly release and straighten your leg. Pelvic Tilt Repeat these steps 5-10 times: 10. Lie on your back on a firm bed or the floor with your legs extended. 11. Bend your knees so they are pointing toward the ceiling and your feet are flat on the floor. 12. Tighten your lower abdominal muscles to press your lower back against the floor. This motion will tilt your pelvis so your tailbone points up toward the ceiling instead of pointing to your feet or the floor. 13. With gentle tension and even breathing, hold this position for 5-10 seconds. Cat-Cow Repeat these steps until your lower back becomes more flexible: 7. Get into a hands-and-knees position on a firm surface. Keep your hands under your shoulders, and keep your knees under your hips. You may place padding under your knees for comfort. 8. Let your head hang down, and point your tailbone toward the floor so your lower back becomes rounded like the back of a cat. 9. Hold this position for 5 seconds. 10. Slowly lift your head and point your tailbone up toward the ceiling so your back forms a sagging arch like the back of a cow. 11. Hold  this position for 5 seconds. Press-Ups Repeat these steps 5-10 times: 6. Lie on your abdomen (face-down) on the floor. 7. Place your palms near your head, about shoulder-width apart. 8. While you keep your back as relaxed as possible and keep your hips on the floor, slowly straighten your arms to raise the top half of your body and lift your shoulders. Do not use your back muscles to raise your upper torso. You may adjust the placement of your hands to make yourself more comfortable. 9. Hold this position for 5 seconds while you keep your back relaxed. 10. Slowly return to lying flat on the floor. Bridges Repeat these steps 10 times: 1. Lie on your back on a firm surface. 2. Bend your knees so they are pointing toward the ceiling and your feet are flat on the floor. 3. Tighten your buttocks muscles and lift your buttocks off of the floor until your waist is at almost the same  height as your knees. You should feel the muscles working in your buttocks and the back of your thighs. If you do not feel these muscles, slide your feet 1-2 inches farther away from your buttocks. 4. Hold this position for 3-5 seconds. 5. Slowly lower your hips to the starting position, and allow your buttocks muscles to relax completely. If this exercise is too easy, try doing it with your arms crossed over your chest. Abdominal Crunches Repeat these steps 5-10 times: 1. Lie on your back on a firm bed or the floor with your legs extended. 2. Bend your knees so they are pointing toward the ceiling and your feet are flat on the floor. 3. Cross your arms over your chest. 4. Tip your chin slightly toward your chest without bending your neck. 5. Tighten your abdominal muscles and slowly raise your trunk (torso) high enough to lift your shoulder blades a tiny bit off of the floor. Avoid raising your torso higher than that, because it can put too much stress on your low back and it does not help to strengthen your abdominal  muscles. 6. Slowly return to your starting position. Back Lifts Repeat these steps 5-10 times: 1. Lie on your abdomen (face-down) with your arms at your sides, and rest your forehead on the floor. 2. Tighten the muscles in your legs and your buttocks. 3. Slowly lift your chest off of the floor while you keep your hips pressed to the floor. Keep the back of your head in line with the curve in your back. Your eyes should be looking at the floor. 4. Hold this position for 3-5 seconds. 5. Slowly return to your starting position. SEEK MEDICAL CARE IF:  Your back pain or discomfort gets much worse when you do an exercise.  Your back pain or discomfort does not lessen within 2 hours after you exercise. If you have any of these problems, stop doing these exercises right away. Do not do them again unless your health care provider says that you can. SEEK IMMEDIATE MEDICAL CARE IF:  You develop sudden, severe back pain. If this happens, stop doing the exercises right away. Do not do them again unless your health care provider says that you can.   This information is not intended to replace advice given to you by your health care provider. Make sure you discuss any questions you have with your health care provider.   Document Released: 12/23/2004 Document Revised: 08/06/2015 Document Reviewed: 01/09/2015 Elsevier Interactive Patient Education [employer]

## 2016-01-31 NOTE — ED Notes (Signed)
Pt ambulates independently and with steady gait at time of discharge. Discharge instructions and follow up information reviewed with patient. No other questions or concerns voiced at this time. RX x 2 given,

## 2016-02-29 ENCOUNTER — Emergency Department (HOSPITAL_COMMUNITY)
Admission: EM | Admit: 2016-02-29 | Discharge: 2016-03-01 | Disposition: A | Discharge status: Home / Self Care | Payer: Self-pay | Attending: Emergency Medicine | Admitting: Emergency Medicine

## 2016-02-29 ENCOUNTER — Encounter (HOSPITAL_COMMUNITY): Payer: Self-pay

## 2016-02-29 ENCOUNTER — Emergency Department (HOSPITAL_COMMUNITY): Payer: Self-pay

## 2016-02-29 DIAGNOSIS — X58XXXA Exposure to other specified factors, initial encounter: Secondary | ICD-10-CM | POA: Insufficient documentation

## 2016-02-29 DIAGNOSIS — Y998 Other external cause status: Secondary | ICD-10-CM | POA: Insufficient documentation

## 2016-02-29 DIAGNOSIS — Y9367 Activity, basketball: Secondary | ICD-10-CM | POA: Insufficient documentation

## 2016-02-29 DIAGNOSIS — Y9289 Other specified places as the place of occurrence of the external cause: Secondary | ICD-10-CM | POA: Insufficient documentation

## 2016-02-29 DIAGNOSIS — S8002XA Contusion of left knee, initial encounter: Secondary | ICD-10-CM | POA: Insufficient documentation

## 2016-02-29 MED ORDER — OXYCODONE-ACETAMINOPHEN 5-325 MG PO TABS
1.0000 | ORAL_TABLET | ORAL | Status: DC | PRN
  Administered 2016-02-29: 1 via ORAL
  Filled 2016-02-29: qty 1

## 2016-02-29 NOTE — ED Notes (Signed)
Pt called no answer 

## 2016-02-29 NOTE — ED Notes (Signed)
Pt called for placement in room, no answer.

## 2016-02-29 NOTE — ED Notes (Signed)
Pt states he was playing basketball and was in the air. When he landed he felt pain in his L knee and hear 2 pops. Pt has a hematoma on the lateral aspect of his L knee

## 2019-07-30 ENCOUNTER — Emergency Department (HOSPITAL_COMMUNITY)
Admission: EM | Admit: 2019-07-30 | Discharge: 2019-07-30 | Disposition: A | Discharge status: Home / Self Care | Payer: Self-pay | Attending: Emergency Medicine | Admitting: Emergency Medicine

## 2019-07-30 ENCOUNTER — Emergency Department (HOSPITAL_COMMUNITY): Payer: Self-pay

## 2019-07-30 ENCOUNTER — Other Ambulatory Visit: Payer: Self-pay

## 2019-07-30 DIAGNOSIS — R072 Precordial pain: Secondary | ICD-10-CM

## 2019-07-30 DIAGNOSIS — F419 Anxiety disorder, unspecified: Secondary | ICD-10-CM

## 2019-07-30 DIAGNOSIS — R45851 Suicidal ideations: Secondary | ICD-10-CM

## 2019-07-30 DIAGNOSIS — Z20828 Contact with and (suspected) exposure to other viral communicable diseases: Secondary | ICD-10-CM | POA: Insufficient documentation

## 2019-07-30 DIAGNOSIS — F332 Major depressive disorder, recurrent severe without psychotic features: Secondary | ICD-10-CM | POA: Insufficient documentation

## 2019-07-30 LAB — BASIC METABOLIC PANEL
Anion gap: 14 (ref 5–15)
BUN: 8 mg/dL (ref 6–20)
CO2: 20 mmol/L — ABNORMAL LOW (ref 22–32)
Calcium: 9.2 mg/dL (ref 8.9–10.3)
Chloride: 106 mmol/L (ref 98–111)
Creatinine, Ser: 1.27 mg/dL — ABNORMAL HIGH (ref 0.61–1.24)
GFR calc Af Amer: >60 mL/min (ref >60)
GFR calc non Af Amer: >60 mL/min (ref >60)
Glucose, Bld: 80 mg/dL (ref 70–99)
Potassium: 3.9 mmol/L (ref 3.5–5.1)
Sodium: 140 mmol/L (ref 135–145)

## 2019-07-30 LAB — CBC
HCT: 42.5 % (ref 39.0–52.0)
Hemoglobin: 14.5 g/dL (ref 13.0–17.0)
MCH: 30.5 pg (ref 26.0–34.0)
MCHC: 34.1 g/dL (ref 30.0–36.0)
MCV: 89.5 fL (ref 80.0–100.0)
Platelets: 313 10*3/uL (ref 150–400)
RBC: 4.75 MIL/uL (ref 4.22–5.81)
RDW: 13.7 % (ref 11.5–15.5)
WBC: 6.3 10*3/uL (ref 4.0–10.5)
nRBC: 0 % (ref 0.0–0.2)

## 2019-07-30 LAB — TROPONIN I (HIGH SENSITIVITY): Troponin I (High Sensitivity): 4 ng/L (ref <18)

## 2019-07-30 LAB — ETHANOL: Alcohol, Ethyl (B): 56 mg/dL — ABNORMAL HIGH (ref <10)

## 2019-07-30 LAB — SARS CORONAVIRUS 2 (TAT 6-24 HRS): SARS Coronavirus 2: NEGATIVE

## 2019-07-30 MED ORDER — IBUPROFEN 400 MG PO TABS: 400.0000 mg | ORAL_TABLET | Freq: Four times a day (QID) | ORAL | Status: DC | PRN

## 2019-07-30 MED ORDER — ALBUTEROL SULFATE HFA 108 (90 BASE) MCG/ACT IN AERS: 1.0000 | INHALATION_SPRAY | Freq: Four times a day (QID) | RESPIRATORY_TRACT | 0 refills | Status: DC | PRN

## 2019-07-30 MED ORDER — LORAZEPAM 1 MG PO TABS: 1.0000 mg | ORAL_TABLET | Freq: Four times a day (QID) | ORAL | Status: DC | PRN

## 2019-07-30 MED ORDER — IBUPROFEN 400 MG PO TABS
400.0000 mg | ORAL_TABLET | Freq: Once | ORAL | Status: AC
  Administered 2019-07-30: 06:00:00 400 mg via ORAL
  Filled 2019-07-30: qty 1

## 2019-07-30 NOTE — ED Provider Notes (Signed)
[facility]   CSN: [id number] Arrival date & time: 07/30/19  0055     History   Chief Complaint Chief Complaint  Patient presents with  . Chest Pain    HPI James Sexton is a 43 y.o. male.     The history is provided by the patient.  Chest Pain Pain location:  Substernal area and L chest Pain quality: aching   Pain radiates to:  Does not radiate Pain severity:  Moderate Onset quality:  Gradual Duration:  2 weeks Timing:  Intermittent Progression:  Unchanged Chronicity:  New Relieved by:  Nothing Worsened by:  Movement (palpation) Associated symptoms: cough, fever, shortness of breath and vomiting   Associated symptoms comment:  Denies hemoptysis  Patient reports has had intermittent left-sided and substernal chest pain for 2 weeks.  He reports that it comes on for several hours.  It is worse with palpation and movement He reports fevers and vomiting and shortness of breath.  No hemoptysis.  He fell recently but is unaware of any injuries to his chest  No known history of CAD/VTE  Patient also reports feeling depressed recently.  He is now having thoughts of harming himself   PMH-Depression Soc hx - smokes cigarettes/THC Past Surgical History:  Procedure Laterality Date  . BACK SURGERY  01/2014   Pins and a rod        Home Medications    Prior to Admission medications   Not on File    Family History No family history on file.  Social History Social History   Tobacco Use  . Smoking status: Never Smoker  Substance Use Topics  . Alcohol use: No  . Drug use: No     Allergies   Penicillins   Review of Systems Review of Systems  Constitutional: Positive for fever.  Respiratory: Positive for cough and shortness of breath.   Cardiovascular: Positive for chest pain.  Gastrointestinal: Positive for vomiting.  Psychiatric/Behavioral: Positive for suicidal ideas.  All other systems reviewed  and are negative.    Physical Exam Updated Vital Signs BP 113/72   Pulse (!) 53   Temp 98.5 F (36.9 C) (Oral)   Resp 17   SpO2 97%   Physical Exam CONSTITUTIONAL: Well developed/well nourished, sleeping, no distress HEAD: Normocephalic/atraumatic EYES: EOMI/PERRL ENMT: Mucous membranes moist NECK: supple no meningeal signs SPINE/BACK:entire spine nontender CV: S1/S2 noted, no murmurs/rubs/gallops noted LUNGS: Lungs are clear to auscultation bilaterally, no apparent distress Chest - tenderness to left chest, no crepitus, no signs of trauma ABDOMEN: soft, nontender, no rebound or guarding, bowel sounds noted throughout abdomen GU:no cva tenderness NEURO: Pt is awake/alert/appropriate, moves all extremitiesx4.  No facial droop.   EXTREMITIES: pulses normal/equal, full ROM, no LE edema noted SKIN: warm, color normal PSYCH: no abnormalities of mood noted, alert and oriented to situation   ED Treatments / Results  Labs (all labs ordered are listed, but only abnormal results are displayed) Labs Reviewed  BASIC METABOLIC PANEL - Abnormal; Notable for the following components:      Result Value   CO2 20 (*)    Creatinine, Ser 1.27 (*)    All other components within normal limits  SARS CORONAVIRUS 2 (TAT 6-12 HRS)  CBC  ETHANOL  RAPID URINE DRUG SCREEN, HOSP PERFORMED  TROPONIN I (HIGH SENSITIVITY)    EKG EKG Interpretation  Date/Time:  Monday July 30 2019 01:00:05 EDT Ventricular Rate:  75 PR Interval:  168 QRS Duration:  80 QT Interval:  388 QTC Calculation: 433 R Axis:   36 Text Interpretation:  Normal sinus rhythm with sinus arrhythmia Anteroseptal infarct , age undetermined Abnormal ECG Interpretation limited secondary to artifact No previous ECGs available Confirmed by Ripley Fraise [phone]) on 07/30/2019 5:14:19 AM   Radiology Dg Chest 2 View  Result Date: 07/30/2019 CLINICAL DATA:  Shortness of breath for 2 weeks EXAM: CHEST - 2 VIEW COMPARISON:  None.  FINDINGS: No consolidation, features of edema, pneumothorax, or effusion. Pulmonary vascularity is normally distributed. The cardiomediastinal contours are unremarkable. No acute osseous or soft tissue abnormality. IMPRESSION: No acute cardiopulmonary abnormality. Electronically Signed   By: Lovena Le M.D.   On: 07/30/2019 01:22    Procedures Procedures   Medications Ordered in ED Medications  ibuprofen (ADVIL) tablet 400 mg (has no administration in time range)  ibuprofen (ADVIL) tablet 400 mg (has no administration in time range)  LORazepam (ATIVAN) tablet 1 mg (has no administration in time range)     Initial Impression / Assessment and Plan / ED Course  I have reviewed the triage vital signs and the nursing notes.  Pertinent labs & imaging results that were available during my care of the patient were reviewed by me and considered in my medical decision making (see chart for details).        5:41 AM Patient reports intermittent chest pain for 2 weeks.  It is reproducible on exam.  No acute EKG changes Low suspicion for ACS/PE/dissection.  Defer further work-up for chest pain.  Patient also reports feeling depressed and suicidal.  Will consult psych.  Patient medically stable  Final Clinical Impressions(s) / ED Diagnoses   Final diagnoses:  Precordial pain  Suicidal ideation    ED Discharge Orders    None       Ripley Fraise, MD 07/30/19 [phone]

## 2019-07-30 NOTE — ED Triage Notes (Signed)
Pt c/o CP that started two weeks ago. Also c/o SOB, N/V, weakness and drowsiness. Pt is in NAD in triage.
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
## 2019-07-30 NOTE — ED Notes (Signed)
TTS bedside 

## 2019-07-30 NOTE — ED Notes (Signed)
Breakfast ordered 

## 2019-07-30 NOTE — ED Notes (Signed)
Per Marijean Bravo [facility]) and [place] Md, pt would benefit from inpatient stay. Patient advised that wife cannot stay with him (per policy) and that he will need to be placed in purple scrubs. Pt agreed.

## 2019-07-30 NOTE — ED Notes (Signed)
Patient has declined inpatient [facility] resources at this time. Both myself, and Wickline MD @ bedside to encourage patient to stay; pt prefers to be discharged.

## 2019-07-30 NOTE — Discharge Instructions (Addendum)
Substance Abuse Treatment Programs ° °Intensive Outpatient Programs °[facility]     °[address]      °[place], [place]                   °[phone]      ° °[facility] °[address] °[place], [place] °336-379-7146 ° °[facility]     °(Inpatient and outpatient)     °[address]           °336-832-9800   ° °[facility] °336-288-1484 (Suboxone and Methadone) ° °[address]      °[place], [place] [address]      °336-882-2125      ° °[address] °[place], [place] °852-3033 ° °[facility] (Outpatient/Inpatient, [facility])    °(insurance only) [phone]      °       °[facility] (Groups & Residential) °[place], [place] °336-389-1413 ° °   °[facility]     °[address]     °[place], [place]      °336-389-1413      ° °[facility] (for caregivers and family) °[address] °[place], [place] °336-299-4655 ° ° ° ° ° °Residential Treatment Programs °[facility]      °[address], [place], [place] [address]  °([phone]      ° °[facility] °[address] [place], [place] [address] °[phone] ° °[facility]        °336-248-8914      ° °[facility] °1-800-659-3381 ° °[facility]             °[address]                                         °[place], [place]                                                °877-615-2722 or 336-784-9470                              ° °[facility] [place] °[address] °[place], [address] °1.877.941.8954 ° °[facility]    °[address]      °[place], [place]     °336-273-5306      ° °[facility] °[address] °, [place] °336-285-9073 ° °[facility]   °[address]     °[place], [place] [address]     °336-899-1550      °Admissions: 8am-3pm M-F ° °[facility] °[address] °[place],  [place] °336-227-7417 ° °[facility]: Residential Program (90 Days)   °[place], [place]      °336-725-8389 or 800-758-6077    ° °[facility] °[place], [place] °(Walk in Hours over the weekend or by referral) ° °[facility] °[address], [place], [place] [address] °([phone]) [phone] ° °Crisis Mobile: [facility]:  1-877-626-1772 (for crisis response 24 hours a day) °[facility] Hotline:      1-800-256-2452 °Outpatient Psychiatry and Counseling ° °[facility]: Mobile Crisis   Management 24 hours:  1-877-626-1772 ° °[facility] sliding scale fee and walk in schedule: M-F 8am-12pm/1pm-3pm °[address]  °[place], [place] [address] °336-387-6161 ° °[facility] °[address] °[place], [place] [address] °336-703-9650 ° °[facility] ([facility] known [facility])- new patient walk-in appointments available Monday - Friday 8am -3pm.          °[address] °[place], [place] [address] °336-676-6840 or crisis line- 336-676-6905 ° °[facility] Intensive Outpatient Therapy Program °[address] °[place], [place] [address] °336-832-9804 ° °[facility]                  °Crisis Services      °336.641.4993      °[address]     °[place], [place] [address]                ° °[facility]   °[facility] °[phone] °[address] °[place], [place] [address] ° ° °[facility]          °[address],  °[place], [place] [address]       °([phone] ° °[facility] °[address] °[place], [place] [address] °336-292-1510 ° °[facility]    °[address]    °[place], [place] [address]     °336-632-3505      ° °Parish McKinney, MD     °[address]     °[place] [address]     °336-282-1251     °  °[facility] °[address] °[place] [address] ° °[facility]     °[address]     °[place], [place]      °336-542-2076      ° °[facility] °Shamsher Ahluwalia, MD °[address] °[place], [place] [address] °336-420-9558 ° °[facility]     °[address]     °[place], [place] [address]     °336-274-1237      ° °[facility] °[address] °[place], [place] [address] °336-854-4450 °Resources for Temporary Residential Assistance/Crisis Centers ° °DAY CENTERS °[facility] °M-F 8am-3pm   °[address] [place], [place] [address]   336-332-0824 °Services include: laundry, barbering, support groups, case management, phone  & computer access, showers, AA/NA mtgs, mental health/substance abuse nurse, job skills class, disability information, VA assistance, spiritual classes, etc.  ° °HOMELESS SHELTERS ° °[facility]     °[facility]   °[address], [place]     °336.271.5959       °       °[facility] (women and children)       °[address] °[place], [place] [address] °336-275-0820 °[email or web address] for application and process °Application Required ° °[facility]   °[address]    °[place] [address]     °336.886.4922       °             °[facility] °[address] °[place], [place] [address] °336.273.5572 °336-235-0363(schedule application appt.) °Application Required ° °[facility] (women only)    °[address]     °[place], [place] [address]     °336-884-1039      °  Intake starts 6pm daily °Need valid ID, SSC, & Police report °[facility] [place] °[address] °[place], [place] °336-881-5420 °Application Required ° °[facility] (men only)     °[address]      °[place], [place]     °336.748.1962      ° °[facility] °(Pregnant women only) °[address] °[place], [place] °336-275-0206 ° °[facility]      °[address]      °[place], [place] [address]     °336-722-9951      °       °[facility] °[address] °[place], [place] °336-723-1848 °90 day commitment/SA/Application process ° °[facility](men only)     °[address]     °[place], [place]     °336-748-1962       °Check-in at 7pm     °       °[facility] °[address] °[place], [place] [address] °336-248-6684 °Men/Women/Women and Children must be there by 7 pm ° °[facility] °[place],  °336-722-8721                ° °

## 2019-07-30 NOTE — ED Provider Notes (Signed)
Pt seen by TTS Accepted for admission However pt declines He is awake/alert, no distress No specific suicide plan He would prefer outpatient f/u Will d/c home Requests albuterol for sob he has occasionally Wife at bedside agrees with plan   Ripley Fraise, MD 07/30/19 [phone]

## 2019-07-30 NOTE — ED Notes (Signed)
Patient states to this nurse "I'm not really going to kill myself, I just need someone to talk to".

## 2019-07-30 NOTE — ED Notes (Signed)
Pt was sleeping in lobby when I went to retrieve him. States pain is more bearable with his eyes closed.

## 2019-09-14 ENCOUNTER — Emergency Department (HOSPITAL_COMMUNITY)
Admission: EM | Admit: 2019-09-14 | Discharge: 2019-09-14 | Disposition: A | Discharge status: Home / Self Care | Payer: Medicaid Other | Attending: Emergency Medicine | Admitting: Emergency Medicine

## 2019-09-14 ENCOUNTER — Other Ambulatory Visit: Payer: Self-pay

## 2019-09-14 ENCOUNTER — Encounter (HOSPITAL_COMMUNITY): Payer: Self-pay | Admitting: Emergency Medicine

## 2019-09-14 DIAGNOSIS — Z79899 Other long term (current) drug therapy: Secondary | ICD-10-CM | POA: Insufficient documentation

## 2019-09-14 DIAGNOSIS — K0889 Other specified disorders of teeth and supporting structures: Secondary | ICD-10-CM | POA: Insufficient documentation

## 2019-09-14 MED ORDER — IBUPROFEN 400 MG PO TABS
600.0000 mg | ORAL_TABLET | Freq: Once | ORAL | Status: AC
  Administered 2019-09-14: 600 mg via ORAL
  Filled 2019-09-14: qty 1

## 2019-09-14 MED ORDER — HYDROCODONE-ACETAMINOPHEN 5-325 MG PO TABS
1.0000 | ORAL_TABLET | Freq: Once | ORAL | Status: AC
  Administered 2019-09-14: 03:00:00 1 via ORAL
  Filled 2019-09-14: qty 1

## 2019-09-14 MED ORDER — CLINDAMYCIN HCL 300 MG PO CAPS: 300.0000 mg | ORAL_CAPSULE | Freq: Four times a day (QID) | ORAL | 0 refills | Status: AC

## 2019-09-14 MED ORDER — LIDOCAINE VISCOUS HCL 2 % MT SOLN: 15.0000 mL | OROMUCOSAL | 2 refills | Status: DC | PRN

## 2019-09-14 NOTE — Discharge Instructions (Signed)
  Dental Pain You have been seen today for dental pain. You should follow up with a dentist as soon as possible. This problem will not resolve on its own without the care of a dentist.  Lidocaine liquid: Use the viscous lidocaine for mouth pain. Swish with the lidocaine and spit it out. Do not swallow it. Salt water solution: You should also swish with a homemade salt water solution, twice a day.  Make this solution by mixing 8 ounces of warm water with about half a teaspoon of salt. Antiinflammatory medications: Take 600 mg of ibuprofen every 6 hours or 440 mg (over the counter dose) to 500 mg (prescription dose) of naproxen every 12 hours for the next 3 days. After this time, these medications may be used as needed for pain. Take these medications with food to avoid upset stomach. Choose only one of these medications, do not take them together. Acetaminophen (generic for Tylenol): Should you continue to have additional pain while taking the ibuprofen or naproxen, you may add in acetaminophen as needed. Your daily total maximum amount of acetaminophen from all sources should be limited to 4000mg/day for persons without liver problems, or 2000mg/day for those with liver problems.  Please take all of your antibiotics until finished!   You may develop abdominal discomfort or diarrhea from the antibiotic.  You may help offset this with probiotics which you can buy or get in yogurt. Do not eat or take the probiotics until 2 hours after your antibiotic.   For prescription assistance, may try using prescription discount sites or apps, such as goodrx.com   Dental Resource Guide  [facility] [address] [place], [place] [address] [phone]  [facility] [address] [place], [place] [address] [phone]  [facility] [address] [place], [place] [address] [phone] ext. [phone]  [facility] [address] [place], [place] [address] [phone]  [facility] [address] [place], [place] [address] [phone]  [employer] Www.denistry.[employer].edu/patientcare/studentclinics/becomepatient  [employer] [address] [place], [place] [address] [phone]  Website for free, low-income, or sliding scale dental services in [place]: www.freedentalcare.us  To find a dentist in [place] and surrounding areas: www.ncdental.org/for-the-public/find-a-dentist  [facility] http://www.ncdental.org/meetings-events/-missions-[facility]-mercy   Medicaid Dentist https://dma.ncdhhs.gov/find-a-doctor/medicaid-dental-providers  

## 2019-09-14 NOTE — ED Notes (Signed)
Patient educated about not driving or performing other critical tasks (such as operating heavy machinery, caring for infant/toddler/child) due to sedative nature of narcotic medications received while in the ED.  Pt/caregiver verbalized understanding.   

## 2019-09-14 NOTE — ED Provider Notes (Signed)
[facility] EMERGENCY DEPARTMENT Provider Note   CSN: [id number] Arrival date & time: 09/14/19  0011     History   Chief Complaint Chief Complaint  Patient presents with  . Dental Pain    HPI DEION SWIFT is a 43 y.o. male.     HPI   DARTANYON FRANKOWSKI is a 43 y.o. male, patient with no pertinent past medical history, presenting to the ED with left lower dental pain for the last 2 to 3 days. Has tried 1 dose of Tylenol. Denies fever/chills, nausea/vomiting, difficulty breathing/swallowing, facial swelling, drainage from the area, trauma, or any other complaints.   History reviewed. No pertinent past medical history.  There are no active problems to display for this patient.   Past Surgical History:  Procedure Laterality Date  . BACK SURGERY  01/2014   Pins and a rod        Home Medications    Prior to Admission medications   Medication Sig Start Date End Date Taking? Authorizing Provider  albuterol (VENTOLIN HFA) 108 (90 Base) MCG/ACT inhaler Inhale 1-2 puffs into the lungs every 6 (six) hours as needed for wheezing or shortness of breath. 07/30/19   Ripley Fraise, MD  clindamycin (CLEOCIN) 300 MG capsule Take 1 capsule (300 mg total) by mouth every 6 (six) hours for 7 days. 09/14/19 09/21/19  Areesha Dehaven C, PA-C  lidocaine (XYLOCAINE) 2 % solution Use as directed 15 mLs in the mouth or throat as needed for mouth pain. 09/14/19   Edwardine Deschepper, Helane Gunther, PA-C    Family History No family history on file.  Social History Social History   Tobacco Use  . Smoking status: Never Smoker  Substance Use Topics  . Alcohol use: No  . Drug use: No     Allergies   Penicillins   Review of Systems Review of Systems  Constitutional: Negative for fever.  HENT: Positive for dental problem. Negative for drooling, facial swelling, trouble swallowing and voice change.   Respiratory: Negative for shortness of breath.   Gastrointestinal: Negative for nausea  and vomiting.  Musculoskeletal: Negative for neck pain.     Physical Exam Updated Vital Signs BP 127/68 (BP Location: Left Arm)   Pulse 75   Temp 98.3 F (36.8 C) (Oral)   Resp 16   SpO2 96%   Physical Exam Vitals signs and nursing note reviewed.  Constitutional:      General: He is not in acute distress.    Appearance: He is well-developed. He is not diaphoretic.  HENT:     Head: Normocephalic and atraumatic.     Mouth/Throat:     Mouth: Mucous membranes are moist.     Comments: Tenderness in the region of the left mandibular rearmost molar.  Dentition appears to be intact and stable.  No noted area of swelling or fluctuance.  No trismus or noted abnormal phonation.  Mouth opening to at least 3 finger widths.  Handles oral secretions without difficulty.  No noted facial swelling.  No sublingual swelling.  No swelling or tenderness to the submental or submandibular regions.  No swelling or tenderness into the soft tissues of the neck. Eyes:     Conjunctiva/sclera: Conjunctivae normal.  Neck:     Musculoskeletal: Normal range of motion and neck supple. No muscular tenderness.  Cardiovascular:     Rate and Rhythm: Normal rate and regular rhythm.  Pulmonary:     Effort: Pulmonary effort is normal.  Lymphadenopathy:  Cervical: No cervical adenopathy.  Skin:    General: Skin is warm and dry.     Coloration: Skin is not pale.  Neurological:     Mental Status: He is alert.  Psychiatric:        Behavior: Behavior normal.      ED Treatments / Results  Labs (all labs ordered are listed, but only abnormal results are displayed) Labs Reviewed - No data to display  EKG None  Radiology No results found.  Procedures Procedures (including critical care time)  Medications Ordered in ED Medications  HYDROcodone-acetaminophen (NORCO/VICODIN) 5-325 MG per tablet 1 tablet (1 tablet Oral Given 09/14/19 0316)  ibuprofen (ADVIL) tablet 600 mg (600 mg Oral Given 09/14/19 0316)      Initial Impression / Assessment and Plan / ED Course  I have reviewed the triage vital signs and the nursing notes.  Pertinent labs & imaging results that were available during my care of the patient were reviewed by me and considered in my medical decision making (see chart for details).        Patient presents with dental pain for the last few days.  Low suspicion for sepsis or Ludwig's angioedema.  Offered dental block with explanation, but patient declined.  Dental follow-up recommended.  Resources given. The patient was given instructions for home care as well as return precautions. Patient voices understanding of these instructions, accepts the plan, and is comfortable with discharge.  Final Clinical Impressions(s) / ED Diagnoses   Final diagnoses:  Pain, dental    ED Discharge Orders         Ordered    clindamycin (CLEOCIN) 300 MG capsule  Every 6 hours    Note to Pharmacy: May be dispensed as 150 mg capsules if more economical for the patient.   09/14/19 0310    lidocaine (XYLOCAINE) 2 % solution  As needed     09/14/19 0310           Anselm Pancoast, PA-C 09/15/19 2011    Nira Conn, MD 09/16/19 [phone]

## 2019-09-14 NOTE — ED Triage Notes (Signed)
C/o L lower dental pain x 4 days.

## 2020-11-06 ENCOUNTER — Encounter (HOSPITAL_COMMUNITY): Payer: Self-pay | Admitting: Emergency Medicine

## 2020-11-06 ENCOUNTER — Ambulatory Visit (HOSPITAL_COMMUNITY)
Admission: EM | Admit: 2020-11-06 | Discharge: 2020-11-06 | Disposition: A | Discharge status: Home / Self Care | Payer: Self-pay | Attending: Family Medicine | Admitting: Family Medicine

## 2020-11-06 ENCOUNTER — Ambulatory Visit (INDEPENDENT_AMBULATORY_CARE_PROVIDER_SITE_OTHER): Payer: Self-pay

## 2020-11-06 ENCOUNTER — Other Ambulatory Visit: Payer: Self-pay

## 2020-11-06 DIAGNOSIS — W19XXXA Unspecified fall, initial encounter: Secondary | ICD-10-CM

## 2020-11-06 DIAGNOSIS — M79642 Pain in left hand: Secondary | ICD-10-CM

## 2020-11-06 DIAGNOSIS — M25561 Pain in right knee: Secondary | ICD-10-CM

## 2020-11-06 MED ORDER — KETOROLAC TROMETHAMINE 30 MG/ML IJ SOLN
30.0000 mg | Freq: Once | INTRAMUSCULAR | Status: AC
  Administered 2020-11-06: 30 mg via INTRAMUSCULAR

## 2020-11-06 MED ORDER — HYDROCODONE-ACETAMINOPHEN 5-325 MG PO TABS
1.0000 | ORAL_TABLET | Freq: Once | ORAL | Status: AC
  Administered 2020-11-06: 1 via ORAL

## 2020-11-06 MED ORDER — HYDROCODONE-ACETAMINOPHEN 5-325 MG PO TABS
ORAL_TABLET | ORAL | Status: AC
  Filled 2020-11-06: qty 1

## 2020-11-06 MED ORDER — CYCLOBENZAPRINE HCL 10 MG PO TABS: 10.0000 mg | ORAL_TABLET | Freq: Two times a day (BID) | ORAL | 0 refills | Status: DC | PRN

## 2020-11-06 MED ORDER — KETOROLAC TROMETHAMINE 30 MG/ML IJ SOLN
INTRAMUSCULAR | Status: AC
  Filled 2020-11-06: qty 1

## 2020-11-06 MED ORDER — IBUPROFEN 800 MG PO TABS: 800.0000 mg | ORAL_TABLET | Freq: Three times a day (TID) | ORAL | 0 refills | Status: DC

## 2020-11-06 MED ORDER — HYDROCODONE-ACETAMINOPHEN 5-325 MG PO TABS: 1.0000 | ORAL_TABLET | Freq: Four times a day (QID) | ORAL | 0 refills | Status: DC | PRN

## 2020-11-06 NOTE — ED Triage Notes (Signed)
Pt presents with right knee pain and left hand pain. States missed the last step at home 2 days ago and fell. States had accident 5 years ago that has caused hx of sciatic nerve pain and serve back pain after back surgery.

## 2020-11-06 NOTE — ED Provider Notes (Signed)
James Sexton    CSN: [id number] Arrival date & time: 11/06/20  1310      History   Chief Complaint Chief Complaint  Patient presents with  . Knee Pain  . Fall    HPI James Sexton is a 44 y.o. male.   Patient is a 44 year old male who presents today with left hand pain, right knee pain after fall.  The fall occurred 2 days ago.  Reporting he missed his last step at home and fell twisting the knee and landing on the left hand.  History of problems with this knee many years ago with torn MCL.  He has had moderate swelling, pain with range of motion and limited ambulation.  No numbness, tingling.  He tried ibuprofen without much relief.     History reviewed. No pertinent past medical history.  There are no problems to display for this patient.   Past Surgical History:  Procedure Laterality Date  . BACK SURGERY  01/2014   Pins and a rod       Home Medications    Prior to Admission medications   Medication Sig Start Date End Date Taking? Authorizing Provider  albuterol (VENTOLIN HFA) 108 (90 Base) MCG/ACT inhaler Inhale 1-2 puffs into the lungs every 6 (six) hours as needed for wheezing or shortness of breath. 07/30/19   Zadie Rhine, MD  cyclobenzaprine (FLEXERIL) 10 MG tablet Take 1 tablet (10 mg total) by mouth 2 (two) times daily as needed for muscle spasms. 11/06/20   Dahlia Byes A, NP  HYDROcodone-acetaminophen (NORCO/VICODIN) 5-325 MG tablet Take 1-2 tablets by mouth every 6 (six) hours as needed. 11/06/20   Dahlia Byes A, NP  ibuprofen (ADVIL) 800 MG tablet Take 1 tablet (800 mg total) by mouth 3 (three) times daily. 11/06/20   Terril Amaro, Gloris Manchester A, NP  lidocaine (XYLOCAINE) 2 % solution Use as directed 15 mLs in the mouth or throat as needed for mouth pain. 09/14/19   Joy, Hillard Danker, PA-C    Family History History reviewed. No pertinent family history.  Social History Social History   Tobacco Use  . Smoking status: Never Smoker  . Smokeless tobacco:  Never Used  Substance Use Topics  . Alcohol use: No  . Drug use: No     Allergies   Penicillins   Review of Systems Review of Systems   Physical Exam Triage Vital Signs ED Triage Vitals  Enc Vitals Group     BP 11/06/20 1331 115/66     Pulse Rate 11/06/20 1331 66     Resp 11/06/20 1331 16     Temp 11/06/20 1331 98.2 F (36.8 C)     Temp Source 11/06/20 1331 Oral     SpO2 11/06/20 1331 100 %     Weight --      Height --      Head Circumference --      Peak Flow --      Pain Score 11/06/20 1329 10     Pain Loc --      Pain Edu? --      Excl. in GC? --    No data found.  Updated Vital Signs BP 115/66 (BP Location: Right Arm)   Pulse 66   Temp 98.2 F (36.8 C) (Oral)   Resp 16   SpO2 100%   Visual Acuity Right Eye Distance:   Left Eye Distance:   Bilateral Distance:    Right Eye Near:   Left Eye Near:  Bilateral Near:     Physical Exam Vitals and nursing note reviewed.  Constitutional:      Appearance: Normal appearance.  HENT:     Head: Normocephalic and atraumatic.  Eyes:     Conjunctiva/sclera: Conjunctivae normal.  Pulmonary:     Effort: Pulmonary effort is normal.  Musculoskeletal:     Left hand: Tenderness and bony tenderness present.     Cervical back: Normal range of motion.     Right knee: Swelling and bony tenderness present. Decreased range of motion. Tenderness present over the medial joint line. MCL laxity present.     Comments: Mild swelling to the left hand. Normal ROM. Abrasions to the hand.   Skin:    General: Skin is warm and dry.  Neurological:     Mental Status: He is alert.  Psychiatric:        Mood and Affect: Mood normal.      UC Treatments / Results  Labs (all labs ordered are listed, but only abnormal results are displayed) Labs Reviewed - No data to display  EKG   Radiology DG Knee Complete 4 Views Right  Result Date: 11/06/2020 CLINICAL DATA:  Knee pain after a fall. EXAM: RIGHT KNEE - COMPLETE 4+ VIEW  COMPARISON:  08/02/2009 FINDINGS: Possible small suprapatellar joint effusion. No acute fracture or dislocation. Mild lateral and patellofemoral compartment osteophyte formation. IMPRESSION: No acute osseous abnormality. Possible small suprapatellar joint effusion. 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 Electronically Signed   By: Jeronimo Greaves M.D.   On: 11/06/2020 14:44   DG Hand Complete Left  Result Date: 11/06/2020 CLINICAL DATA:  Left hand pain after fall EXAM: LEFT HAND - COMPLETE 3+ VIEW COMPARISON:  None. FINDINGS: There is no evidence of fracture or dislocation. Cortical thickening along the ulnar aspect of the proximal middle phalanx of the left long finger likely reflecting sequela of remote trauma. There is no evidence of arthropathy or other focal bone abnormality. Soft tissues are unremarkable. IMPRESSION: No acute osseous abnormality, left hand. Electronically Signed   By: Duanne Guess D.O.   On: 11/06/2020 14:49    Procedures Procedures (including critical care time)  Medications Ordered in UC Medications  ketorolac (TORADOL) 30 MG/ML injection 30 mg (30 mg Intramuscular Given 11/06/20 1537)  HYDROcodone-acetaminophen (NORCO/VICODIN) 5-325 MG per tablet 1 tablet (1 tablet Oral Given 11/06/20 1536)    Initial Impression / Assessment and Plan / UC Course  I have reviewed the triage vital signs and the nursing notes.  Pertinent labs & imaging results that were available during my care of the patient were reviewed by me and considered in my medical decision making (see chart for details).     Knee pain-concern for possible MCL injury.  X-ray without any acute findings besides suprapatellar effusion.  Will immobilize knee with knee brace and have him use crutches to be nonweightbearing at this point.  Rest, ice, elevate. Ibuprofen for pain, inflammation and swelling.  Hydrocodone for severe pain as needed. Pt requesting muscle relaxants. Prescribed flexeril Referral placed for  sports medicine for follow-up  Hand pain No acute findings on x ray.  Final Clinical Impressions(s) / UC Diagnoses   Final diagnoses:  Acute pain of right knee  Fall, initial encounter  Hand pain, left     Discharge Instructions     Your x ray was normal of the hand You do have some fluid in the right knee from injury.  We are going to place you in a knee brace  and give you crutches. I would like for you to stay off the knee until seen by the sports med specialist. Rest, Ice, Elevate the leg.  Ibuprofen 800 mg for pain, inflammation and swelling.  Hydrocodone for severe pain as needed.  Referral for sports medicine placed.      ED Prescriptions    Medication Sig Dispense Auth. Provider   ibuprofen (ADVIL) 800 MG tablet Take 1 tablet (800 mg total) by mouth 3 (three) times daily. 21 tablet Makailey Hodgkin A, NP   HYDROcodone-acetaminophen (NORCO/VICODIN) 5-325 MG tablet Take 1-2 tablets by mouth every 6 (six) hours as needed. 12 tablet Demetrius Mahler A, NP   cyclobenzaprine (FLEXERIL) 10 MG tablet Take 1 tablet (10 mg total) by mouth 2 (two) times daily as needed for muscle spasms. 20 tablet Momodou Consiglio A, NP     I have reviewed the PDMP during this encounter.   Janace Aris, NP 11/07/20 [phone]

## 2020-11-06 NOTE — Discharge Instructions (Addendum)
Your x ray was normal of the hand You do have some fluid in the right knee from injury.  We are going to place you in a knee brace and give you crutches. I would like for you to stay off the knee until seen by the sports med specialist. Rest, Ice, Elevate the leg.  Ibuprofen 800 mg for pain, inflammation and swelling.  Hydrocodone for severe pain as needed.  Referral for sports medicine placed.

## 2021-07-29 ENCOUNTER — Ambulatory Visit (HOSPITAL_COMMUNITY)
Admission: EM | Admit: 2021-07-29 | Discharge: 2021-07-29 | Disposition: A | Discharge status: Home / Self Care | Payer: Self-pay | Attending: Internal Medicine | Admitting: Internal Medicine

## 2021-07-29 ENCOUNTER — Emergency Department (HOSPITAL_COMMUNITY)
Admission: EM | Admit: 2021-07-29 | Discharge: 2021-07-29 | Disposition: A | Discharge status: Home / Self Care | Payer: Self-pay | Attending: Emergency Medicine | Admitting: Emergency Medicine

## 2021-07-29 ENCOUNTER — Emergency Department (HOSPITAL_COMMUNITY): Payer: Self-pay

## 2021-07-29 ENCOUNTER — Encounter (HOSPITAL_COMMUNITY): Payer: Self-pay

## 2021-07-29 ENCOUNTER — Encounter (HOSPITAL_COMMUNITY): Payer: Self-pay | Admitting: Emergency Medicine

## 2021-07-29 ENCOUNTER — Other Ambulatory Visit: Payer: Self-pay

## 2021-07-29 DIAGNOSIS — R1032 Left lower quadrant pain: Secondary | ICD-10-CM

## 2021-07-29 DIAGNOSIS — R1012 Left upper quadrant pain: Secondary | ICD-10-CM | POA: Insufficient documentation

## 2021-07-29 LAB — COMPREHENSIVE METABOLIC PANEL
ALT: 17 U/L (ref 0–44)
AST: 18 U/L (ref 15–41)
Albumin: 3.4 g/dL — ABNORMAL LOW (ref 3.5–5.0)
Alkaline Phosphatase: 59 U/L (ref 38–126)
Anion gap: 9 (ref 5–15)
BUN: 8 mg/dL (ref 6–20)
CO2: 28 mmol/L (ref 22–32)
Calcium: 9 mg/dL (ref 8.9–10.3)
Chloride: 99 mmol/L (ref 98–111)
Creatinine, Ser: 1.12 mg/dL (ref 0.61–1.24)
GFR, Estimated: >60 mL/min (ref >60)
Glucose, Bld: 98 mg/dL (ref 70–99)
Potassium: 3.3 mmol/L — ABNORMAL LOW (ref 3.5–5.1)
Sodium: 136 mmol/L (ref 135–145)
Total Bilirubin: 0.7 mg/dL (ref 0.3–1.2)
Total Protein: 6.8 g/dL (ref 6.5–8.1)

## 2021-07-29 LAB — CBC
HCT: 41.5 % (ref 39.0–52.0)
Hemoglobin: 13.8 g/dL (ref 13.0–17.0)
MCH: 28.9 pg (ref 26.0–34.0)
MCHC: 33.3 g/dL (ref 30.0–36.0)
MCV: 87 fL (ref 80.0–100.0)
Platelets: 327 10*3/uL (ref 150–400)
RBC: 4.77 MIL/uL (ref 4.22–5.81)
RDW: 13.3 % (ref 11.5–15.5)
WBC: 6.1 10*3/uL (ref 4.0–10.5)
nRBC: 0 % (ref 0.0–0.2)

## 2021-07-29 LAB — POCT URINALYSIS DIPSTICK, ED / UC
Glucose, UA: NEGATIVE mg/dL
Hgb urine dipstick: NEGATIVE
Ketones, ur: NEGATIVE mg/dL
Leukocytes,Ua: NEGATIVE
Nitrite: NEGATIVE
Protein, ur: 30 mg/dL — AB
Specific Gravity, Urine: >=1.03 (ref 1.005–1.030)
Urobilinogen, UA: 0.2 mg/dL (ref 0.0–1.0)
pH: 6 (ref 5.0–8.0)

## 2021-07-29 LAB — LIPASE, BLOOD: Lipase: 33 U/L (ref 11–51)

## 2021-07-29 MED ORDER — METRONIDAZOLE 500 MG PO TABS
500.0000 mg | ORAL_TABLET | Freq: Once | ORAL | Status: AC
  Administered 2021-07-29: 500 mg via ORAL
  Filled 2021-07-29: qty 1

## 2021-07-29 MED ORDER — HYDROCODONE-ACETAMINOPHEN 5-325 MG PO TABS: 1.0000 | ORAL_TABLET | ORAL | 0 refills | Status: DC | PRN

## 2021-07-29 MED ORDER — METRONIDAZOLE 500 MG PO TABS: 500.0000 mg | ORAL_TABLET | Freq: Two times a day (BID) | ORAL | 0 refills | Status: DC

## 2021-07-29 MED ORDER — HYDROCODONE-ACETAMINOPHEN 5-325 MG PO TABS
1.0000 | ORAL_TABLET | Freq: Once | ORAL | Status: AC
Start: 2021-07-29 — End: 2021-07-29
  Administered 2021-07-29: 1 via ORAL
  Filled 2021-07-29: qty 1

## 2021-07-29 MED ORDER — CIPROFLOXACIN HCL 500 MG PO TABS
500.0000 mg | ORAL_TABLET | Freq: Once | ORAL | Status: AC
  Administered 2021-07-29: 500 mg via ORAL
  Filled 2021-07-29: qty 1

## 2021-07-29 MED ORDER — CIPROFLOXACIN HCL 500 MG PO TABS: 500.0000 mg | ORAL_TABLET | Freq: Two times a day (BID) | ORAL | 0 refills | Status: DC

## 2021-07-29 NOTE — ED Triage Notes (Signed)
Pt also reports frequent loose stools BID since abdominal pain started.  Decreased appetite.  No n/v.

## 2021-07-29 NOTE — ED Triage Notes (Signed)
Pt states he has been having left lower abd pain that wraps around into his back since Sunday. Pt denies nausea. Denies painful urination and also denies blood in urine.

## 2021-07-29 NOTE — Discharge Instructions (Addendum)
CT shows evidence for inflammatory bowel disease.  We are treating you with 2 antibiotics to help this improve.  It is important to call the gastroenterology office for a follow-up appointment to be seen as soon as possible.  Stay on a low fiber diet, for now.  See the attached information.

## 2021-07-29 NOTE — ED Provider Notes (Signed)
[facility]    CSN: [id number] Arrival date & time: 07/29/21  1002      History   Chief Complaint Chief Complaint  Patient presents with   Abdominal Pain   Chest Pain   Shortness of Breath   Hand Pain    HPI James Sexton is a 45 y.o. male.   HPI  Abdominal Pain: Pt reports that for the past 2-4 days he has had chest and abdominal pain.  He describes that the abdominal pain is by far his worst symptom.  He states that the pain is in his left lower quadrant to left flank.  He rates this as severe pain.  Pain is worse when he walks or tries to bend his abdomen.  He has tried over-the-counter medications for pain without any improvement and has also taken Pepto-Bismol without much improvement.  He denies any fevers, dysuria, hematuria, bloody vomiting or bloody bowel movements.  History reviewed. No pertinent past medical history.  There are no problems to display for this patient.   Past Surgical History:  Procedure Laterality Date   BACK SURGERY  01/2014   Pins and a rod     Home Medications    Prior to Admission medications   Medication Sig Start Date End Date Taking? Authorizing Provider  albuterol (VENTOLIN HFA) 108 (90 Base) MCG/ACT inhaler Inhale 1-2 puffs into the lungs every 6 (six) hours as needed for wheezing or shortness of breath. 07/30/19   Zadie Rhine, MD  cyclobenzaprine (FLEXERIL) 10 MG tablet Take 1 tablet (10 mg total) by mouth 2 (two) times daily as needed for muscle spasms. 11/06/20   Dahlia Byes A, NP  HYDROcodone-acetaminophen (NORCO/VICODIN) 5-325 MG tablet Take 1-2 tablets by mouth every 6 (six) hours as needed. 11/06/20   Dahlia Byes A, NP  ibuprofen (ADVIL) 800 MG tablet Take 1 tablet (800 mg total) by mouth 3 (three) times daily. 11/06/20   Bast, Gloris Manchester A, NP  lidocaine (XYLOCAINE) 2 % solution Use as directed 15 mLs in the mouth or throat as needed for mouth pain. 09/14/19   Joy, Hillard Danker, PA-C    Family History Family History   Problem Relation Age of Onset   Hypertension Mother    Diabetes Mother    Hypertension Father    Diabetes Father     Social History Social History   Tobacco Use   Smoking status: Never   Smokeless tobacco: Never  Vaping Use   Vaping Use: Never used  Substance Use Topics   Alcohol use: No   Drug use: Yes    Frequency: 7.0 times per week    Types: Marijuana     Allergies   Penicillins   Review of Systems Review of Systems  As stated above in HPI Physical Exam Triage Vital Signs ED Triage Vitals [07/29/21 1017]  Enc Vitals Group     BP      Pulse      Resp      Temp      Temp src      SpO2      Weight      Height      Head Circumference      Peak Flow      Pain Score 10     Pain Loc      Pain Edu?      Excl. in GC?    No data found.  Updated Vital Signs BP 119/69   Pulse 61  Temp 97.6 F (36.4 C)   Resp 20   SpO2 100%   Physical Exam Vitals and nursing note reviewed.  Constitutional:      General: He is in acute distress.     Appearance: He is well-developed. He is ill-appearing. He is not toxic-appearing (laying on examination table holding his abdomen) or diaphoretic.  HENT:     Head: Normocephalic and atraumatic.     Mouth/Throat:     Mouth: Mucous membranes are moist.  Eyes:     Extraocular Movements: Extraocular movements intact.     Pupils: Pupils are equal, round, and reactive to light.  Cardiovascular:     Rate and Rhythm: Normal rate and regular rhythm.     Heart sounds: Normal heart sounds.  Pulmonary:     Effort: Pulmonary effort is normal.     Breath sounds: Normal breath sounds.  Abdominal:     General: Abdomen is flat. Bowel sounds are normal. There is distension.     Palpations: Abdomen is rigid. There is no hepatomegaly, splenomegaly or mass.     Tenderness: There is abdominal tenderness in the suprapubic area and left lower quadrant. There is left CVA tenderness and guarding. There is no right CVA tenderness or rebound.  Negative signs include Murphy's sign and McBurney's sign.     Hernia: No hernia is present.  Skin:    General: Skin is warm.     Coloration: Skin is not cyanotic or jaundiced.  Neurological:     Mental Status: He is alert and oriented to person, place, and time.     UC Treatments / Results  Labs (all labs ordered are listed, but only abnormal results are displayed) Labs Reviewed  POCT URINALYSIS DIPSTICK, ED / UC - Abnormal; Notable for the following components:      Result Value   Bilirubin Urine SMALL (*)    Protein, ur 30 (*)    All other components within normal limits    EKG   Radiology No results found.  Procedures Procedures (including critical care time)  Medications Ordered in UC Medications - No data to display  Initial Impression / Assessment and Plan / UC Course  I have reviewed the triage vital signs and the nursing notes.  Pertinent labs & imaging results that were available during my care of the patient were reviewed by me and considered in my medical decision making (see chart for details).     New.  Patient is presenting with a surgical abdomen though his vital signs are stable.  He discussed this is chest pain and shortness of breath are off and on and not currently active.  EKG is relatively stable although there is mild nonconcerning changes in V3 and V4.  Currently I am mainly concerned about his left lower abdominal pain.  I discussed this with patient.  He is agreeable to go to the hospital for further work-up as he will need a CT scan and blood work.  He would like to walk to the emergency room and does not want to take EMS. He will be NPO Final Clinical Impressions(s) / UC Diagnoses   Final diagnoses:  Left lower quadrant abdominal pain   Discharge Instructions   None    ED Prescriptions   None    PDMP not reviewed this encounter.   Rushie Chestnut, [place] 07/29/21 1129

## 2021-07-29 NOTE — ED Triage Notes (Signed)
Pt presents with L abdominal pain x 2 days, worse when working yesterday.  States he feels like L side of abdomen is swollen.   Pt also reports chest tightness intermittent x 4 days.  Has had these intermittent episodes of tightness before and lasts up to a week.  No aggravating factors.     Pt reports episodes of SOB sometimes random and sometimes associated with the abd or chest pain.   Pt would also like L hand evaluated.  Stiffness and pain periodically x 2 years.  Sometimes pain shoots up into forearm.

## 2021-07-29 NOTE — ED Provider Notes (Signed)
[facility] EMERGENCY DEPARTMENT Provider Note   CSN[id number] [facility]: [facility]     History Chief Complaint  Patient presents with   Abdominal Pain    James Sexton is a 45 y.o. male.  HPI Presents for evaluation of left flank pain, radiating to the left upper abdomen, present for 2 days without trauma.  He denies dysuria, urinary frequency, hematuria, fever, chills, cough or shortness of breath.  He went to an urgent care center and was referred here for evaluation was suspected severe intra-abdominal process.  Patient denies similar history.  He states he has been eating well.  He denies change in bowel habits recently.  There are no other known active modifying factors.    History reviewed. No pertinent past medical history.  There are no problems to display for this patient.   Past Surgical History:  Procedure Laterality Date   BACK SURGERY  01/2014   Pins and a rod       Family History  Problem Relation Age of Onset   Hypertension Mother    Diabetes Mother    Hypertension Father    Diabetes Father     Social History   Tobacco Use   Smoking status: Never   Smokeless tobacco: Never  Vaping Use   Vaping Use: Never used  Substance Use Topics   Alcohol use: Yes   Drug use: Yes    Frequency: 7.0 times per week    Types: Marijuana    Home Medications Prior to Admission medications   Medication Sig Start Date End Date Taking? Authorizing Provider  ciprofloxacin (CIPRO) 500 MG tablet Take 1 tablet (500 mg total) by mouth 2 (two) times daily. One po bid x 7 days 07/29/21  Yes Mancel BaleWentz, Elizeo Rodriques, MD  HYDROcodone-acetaminophen (NORCO) 5-325 MG tablet Take 1 tablet by mouth every 4 (four) hours as needed for moderate pain or severe pain. 07/29/21  Yes Mancel BaleWentz, Andon Villard, MD  metroNIDAZOLE (FLAGYL) 500 MG tablet Take 1 tablet (500 mg total) by mouth 2 (two) times daily. One po bid x 7 days 07/29/21  Yes Mancel BaleWentz, Jayvon Mounger, MD  albuterol  (VENTOLIN HFA) 108 (90 Base) MCG/ACT inhaler Inhale 1-2 puffs into the lungs every 6 (six) hours as needed for wheezing or shortness of breath. 07/30/19   Zadie RhineWickline, Donald, MD  cyclobenzaprine (FLEXERIL) 10 MG tablet Take 1 tablet (10 mg total) by mouth 2 (two) times daily as needed for muscle spasms. 11/06/20   Dahlia ByesBast, Traci A, NP  ibuprofen (ADVIL) 800 MG tablet Take 1 tablet (800 mg total) by mouth 3 (three) times daily. 11/06/20   Bast, Gloris Manchesterraci A, NP  lidocaine (XYLOCAINE) 2 % solution Use as directed 15 mLs in the mouth or throat as needed for mouth pain. 09/14/19   Joy, Shawn C, PA-C    Allergies    Penicillins  Review of Systems   Review of Systems  All other systems reviewed and are negative.  Physical Exam Updated Vital Signs BP 120/67   Pulse (!) 57   Temp 97.6 F (36.4 C) (Oral)   Resp 16   SpO2 94%   Physical Exam Vitals and nursing note reviewed.  Constitutional:      General: He is not in acute distress.    Appearance: He is well-developed. He is not ill-appearing, toxic-appearing or diaphoretic.  HENT:     Head: Normocephalic and atraumatic.     Right Ear: External ear normal.     Left  Ear: External ear normal.     Mouth/Throat:     Mouth: Mucous membranes are moist.  Eyes:     Conjunctiva/sclera: Conjunctivae normal.     Pupils: Pupils are equal, round, and reactive to light.  Neck:     Trachea: Phonation normal.  Cardiovascular:     Rate and Rhythm: Normal rate.  Pulmonary:     Effort: Pulmonary effort is normal.  Abdominal:     General: There is no distension.     Tenderness: There is no abdominal tenderness.  Musculoskeletal:        General: Normal range of motion.     Cervical back: Normal range of motion and neck supple.  Skin:    General: Skin is warm and dry.  Neurological:     Mental Status: He is alert and oriented to person, place, and time.     Cranial Nerves: No cranial nerve deficit.     Sensory: No sensory deficit.     Motor: No abnormal  muscle tone.     Coordination: Coordination normal.  Psychiatric:        Mood and Affect: Mood normal.        Behavior: Behavior normal.        Thought Content: Thought content normal.        Judgment: Judgment normal.    ED Results / Procedures / Treatments   Labs (all labs ordered are listed, but only abnormal results are displayed) Labs Reviewed  COMPREHENSIVE METABOLIC PANEL - Abnormal; Notable for the following components:      Result Value   Potassium 3.3 (*)    Albumin 3.4 (*)    All other components within normal limits  LIPASE, BLOOD  CBC    EKG None  Radiology CT Renal Stone Study  Result Date: 07/29/2021 CLINICAL DATA:  Left-sided flank pain for several days, initial encounter EXAM: CT ABDOMEN AND PELVIS WITHOUT CONTRAST TECHNIQUE: Multidetector CT imaging of the abdomen and pelvis was performed following the standard protocol without IV contrast. COMPARISON:  03/30/2015 FINDINGS: Lower chest: Calcified granuloma is noted in the left lower lobe. No other focal abnormality is seen. Hepatobiliary: Right hepatic cyst is noted stable in appearance from the prior exam. Gallbladder is decompressed. Pancreas: Unremarkable. No pancreatic ductal dilatation or surrounding inflammatory changes. Spleen: Normal in size without focal abnormality. Adrenals/Urinary Tract: Adrenal glands are within normal limits. Kidneys are well visualized bilaterally. No renal calculi are noted. No obstructive changes are seen. Bladder is decompressed. Stomach/Bowel: No obstructive changes are noted within the colon. There are edematous changes and mild pericolonic inflammatory change in the ascending colon consistent with focal colitis. No abscess is seen. Similar findings are noted within the terminal ileum. This raises suspicion for underlying inflammatory bowel disease. The appendix is within normal limits. Proximal small bowel and stomach are unremarkable. Vascular/Lymphatic: Aortic atherosclerosis. No  enlarged abdominal or pelvic lymph nodes. Reproductive: Prostate is unremarkable. Other: No abdominal wall hernia or abnormality. No abdominopelvic ascites. Musculoskeletal: Postsurgical changes are noted in the lower lumbar spine at L5-S1. IMPRESSION: Inflammatory changes involving the distal aspect of the terminal ileum as well as the ascending colon suspicious for inflammatory bowel disease. No abscess or perforation is noted. Direct visualization may be helpful. No renal calculi or obstructive changes are noted. Normal-appearing appendix. Stable right hepatic cyst. Electronically Signed   By: Alcide Clever M.D.   On: 07/29/2021 14:30    Procedures Procedures   Medications Ordered in ED Medications  HYDROcodone-acetaminophen (NORCO/VICODIN)  5-325 MG per tablet 1 tablet (1 tablet Oral Given 07/29/21 1547)  metroNIDAZOLE (FLAGYL) tablet 500 mg (500 mg Oral Given 07/29/21 1547)  ciprofloxacin (CIPRO) tablet 500 mg (500 mg Oral Given 07/29/21 1547)    ED Course  I have reviewed the triage vital signs and the nursing notes.  Pertinent labs & imaging results that were available during my care of the patient were reviewed by me and considered in my medical decision making (see chart for details).  Clinical Course as of 07/30/21 1046  Wed Jul 29, 2021  1501 Case was discussed with on-call GI provider who recommends initiation of oral antibiotics and follow-up in office. [EW]    Clinical Course User Index [EW] Mancel Bale, MD   MDM Rules/Calculators/A&P                            Patient Vitals for the past 24 hrs:  BP Temp Temp src Pulse Resp SpO2  07/29/21 1547 -- 97.6 F (36.4 C) Oral -- -- --  07/29/21 1530 -- -- -- (!) 57 16 94 %  07/29/21 1515 120/67 -- -- (!) 57 17 99 %  07/29/21 1500 -- -- -- 60 17 97 %  07/29/21 1445 115/72 -- -- 76 19 92 %  07/29/21 1354 -- -- -- 65 18 97 %  07/29/21 1353 104/72 -- -- 66 (!) 23 98 %  07/29/21 1151 112/78 98.1 F (36.7 C) Oral 73 18 100 %     At the time of discharge- reevaluation with update and discussion. After initial assessment and treatment, an updated evaluation reveals he is comfortable and has no further complaints.Mancel Bale   Medical Decision Making:  This patient is presenting for evaluation of left flank pain, which does require a range of treatment options, and is a complaint that involves a moderate risk of morbidity and mortality. The differential diagnoses include urinary tract infection, obstructing kidney stone, nonspecific intra-abdominal process. I decided to review old records, and in summary healthy middle-aged male presenting with persistent flank pain for 2 days without associated gastrointestinal symptoms, fever or weakness..  I did not require additional historical information from anyone.  Clinical Laboratory Tests Ordered, included CBC, Metabolic panel, and lipase, urinalysis . Review indicates normal except potassium slightly low, urinalysis not obtained prior to discharge. Radiologic Tests Ordered, included CT abdomen pelvis.  I independently Visualized: Consistent with inflammatory bowel disease, but no urolithiasis.  Images, which show negative kidney stone disease    Critical Interventions-clinical evaluation, discussing with gastroenterology, laboratory testing, CT imaging, reevaluation, medication treatment  After These Interventions, the Patient was reevaluated and was found stable for discharge with suspected inflammatory bowel disease of nonspecific nature.  No prior similar.  No chronic illnesses.  Patient will be covered with antibiotics pending follow-up with gastroenterology.  He is nontoxic and does not require hospitalization.  CRITICAL CARE-no Performed by: Mancel Bale  Nursing Notes Reviewed/ Care Coordinated Applicable Imaging Reviewed Interpretation of Laboratory Data incorporated into ED treatment  The patient appears reasonably screened and/or stabilized for  discharge and I doubt any other medical condition or other [facility] requiring further screening, evaluation, or treatment in the ED at this time prior to discharge.  Plan: Home Medications-OTC as needed; Home Treatments-low fiber diet; return here if the recommended treatment, does not improve the symptoms; Recommended follow up-GI follow-up 1 to 2 weeks     Final Clinical Impression(s) / ED Diagnoses Final diagnoses:  Left upper quadrant abdominal pain    Rx / DC Orders ED Discharge Orders          Ordered    HYDROcodone-acetaminophen (NORCO) 5-325 MG tablet  Every 4 hours PRN        07/29/21 1554    ciprofloxacin (CIPRO) 500 MG tablet  2 times daily        07/29/21 1554    metroNIDAZOLE (FLAGYL) 500 MG tablet  2 times daily        07/29/21 1554             Mancel Bale, MD 07/30/21 1046

## 2021-10-21 ENCOUNTER — Ambulatory Visit (HOSPITAL_COMMUNITY)
Admission: EM | Admit: 2021-10-21 | Discharge: 2021-10-21 | Disposition: A | Discharge status: Home / Self Care | Payer: Self-pay | Attending: Family Medicine | Admitting: Family Medicine

## 2021-10-21 ENCOUNTER — Encounter (HOSPITAL_COMMUNITY): Payer: Self-pay

## 2021-10-21 ENCOUNTER — Other Ambulatory Visit: Payer: Self-pay

## 2021-10-21 DIAGNOSIS — M5442 Lumbago with sciatica, left side: Secondary | ICD-10-CM

## 2021-10-21 DIAGNOSIS — M5441 Lumbago with sciatica, right side: Secondary | ICD-10-CM

## 2021-10-21 MED ORDER — DEXAMETHASONE SODIUM PHOSPHATE 10 MG/ML IJ SOLN
INTRAMUSCULAR | Status: AC
  Filled 2021-10-21: qty 1

## 2021-10-21 MED ORDER — CYCLOBENZAPRINE HCL 5 MG PO TABS: 5.0000 mg | ORAL_TABLET | Freq: Three times a day (TID) | ORAL | 0 refills | Status: DC | PRN

## 2021-10-21 MED ORDER — DEXAMETHASONE SODIUM PHOSPHATE 10 MG/ML IJ SOLN
10.0000 mg | Freq: Once | INTRAMUSCULAR | Status: AC
  Administered 2021-10-21: 10 mg via INTRAMUSCULAR

## 2021-10-21 MED ORDER — NAPROXEN 500 MG PO TABS: 500.0000 mg | ORAL_TABLET | Freq: Two times a day (BID) | ORAL | 0 refills | Status: DC | PRN

## 2021-10-21 NOTE — ED Triage Notes (Signed)
Patient presents to the office for lower back pain x 2-3 days.

## 2021-10-21 NOTE — ED Provider Notes (Signed)
James Sexton    CSN: [id number] Arrival date & time: 10/21/21  1518      History   Chief Complaint Chief Complaint  Patient presents with   Back Pain    HPI James Sexton is a 45 y.o. male.   Patient presenting today with bilateral mid and low back soreness, stiffness with pain radiating down both legs at times.  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  So far has not been tried anything over-the-counter for symptoms but has missed 2 days of work due to symptoms.  Denies gait change, leg weakness, bowel or bladder incontinence, saddle anesthesia, fever, chills.   History reviewed. No pertinent past medical history.  There are no problems to display for this patient.   Past Surgical History:  Procedure Laterality Date   BACK SURGERY  01/2014   Pins and a rod       Home Medications    Prior to Admission medications   Medication Sig Start Date End Date Taking? Authorizing Provider  cyclobenzaprine (FLEXERIL) 5 MG tablet Take 1 tablet (5 mg total) by mouth 3 (three) times daily as needed for muscle spasms. Do not drink alcohol or drive while taking this medication.  May cause drowsiness. 10/21/21  Yes Volney American, PA-C  naproxen (NAPROSYN) 500 MG tablet Take 1 tablet (500 mg total) by mouth 2 (two) times daily as needed. 10/21/21  Yes Volney American, PA-C  albuterol (VENTOLIN HFA) 108 (90 Base) MCG/ACT inhaler Inhale 1-2 puffs into the lungs every 6 (six) hours as needed for wheezing or shortness of breath. 07/30/19   Ripley Fraise, MD  ciprofloxacin (CIPRO) 500 MG tablet Take 1 tablet (500 mg total) by mouth 2 (two) times daily. One po bid x 7 days 07/29/21   Daleen Bo, MD  cyclobenzaprine (FLEXERIL) 10 MG tablet Take 1 tablet (10 mg total) by mouth 2 (two) times daily as needed for muscle spasms. 11/06/20   Loura Halt A, NP   HYDROcodone-acetaminophen (NORCO) 5-325 MG tablet Take 1 tablet by mouth every 4 (four) hours as needed for moderate pain or severe pain. 07/29/21   Daleen Bo, MD  ibuprofen (ADVIL) 800 MG tablet Take 1 tablet (800 mg total) by mouth 3 (three) times daily. 11/06/20   Bast, Tressia Miners A, NP  lidocaine (XYLOCAINE) 2 % solution Use as directed 15 mLs in the mouth or throat as needed for mouth pain. 09/14/19   Joy, Shawn C, PA-C  metroNIDAZOLE (FLAGYL) 500 MG tablet Take 1 tablet (500 mg total) by mouth 2 (two) times daily. One po bid x 7 days 07/29/21   Daleen Bo, MD    Family History Family History  Problem Relation Age of Onset   Hypertension Mother    Diabetes Mother    Hypertension Father    Diabetes Father     Social History Social History   Tobacco Use   Smoking status: Never   Smokeless tobacco: Never  Vaping Use   Vaping Use: Never used  Substance Use Topics   Alcohol use: Yes   Drug use: Yes    Frequency: 7.0 times per week    Types: Marijuana     Allergies   Penicillins   Review of Systems Review of Systems Per HPI  Physical Exam Triage Vital Signs ED Triage Vitals  Enc Vitals Group     BP 10/21/21  1733 111/78     Pulse Rate 10/21/21 1733 70     Resp --      Temp 10/21/21 1733 98.1 F (36.7 C)     Temp Source 10/21/21 1733 Oral     SpO2 --      Weight --      Height --      Head Circumference --      Peak Flow --      Pain Score 10/21/21 1734 10     Pain Loc --      Pain Edu? --      Excl. in GC? --    No data found.  Updated Vital Signs BP 111/78 (BP Location: Left Arm)   Pulse 70   Temp 98.1 F (36.7 C) (Oral)   Visual Acuity Right Eye Distance:   Left Eye Distance:   Bilateral Distance:    Right Eye Near:   Left Eye Near:    Bilateral Near:     Physical Exam Vitals and nursing note reviewed.  Constitutional:      Appearance: Normal appearance.  HENT:     Head: Atraumatic.  Eyes:     Extraocular Movements: Extraocular  movements intact.     Conjunctiva/sclera: Conjunctivae normal.  Cardiovascular:     Rate and Rhythm: Normal rate and regular rhythm.  Pulmonary:     Effort: Pulmonary effort is normal.     Breath sounds: Normal breath sounds.  Musculoskeletal:        General: Tenderness present. No swelling, deformity or signs of injury. Normal range of motion.     Cervical back: Normal range of motion and neck supple.     Comments: No midline spinal tenderness to palpation bilaterally.  Negative straight leg raise bilaterally lower extremities.  Bilateral thoracic and lumbar paraspinal muscles tender to palpation and bilateral lateral hips tender to palpation.  Antalgic gait.  Skin:    General: Skin is warm and dry.     Findings: No bruising or erythema.  Neurological:     General: No focal deficit present.     Mental Status: He is oriented to person, place, and time.     Comments: Bilateral lower extremities neurovascularly intact  Psychiatric:        Mood and Affect: Mood normal.        Thought Content: Thought content normal.        Judgment: Judgment normal.     UC Treatments / Results  Labs (all labs ordered are listed, but only abnormal results are displayed) Labs Reviewed - No data to display  EKG   Radiology No results found.  Procedures Procedures (including critical care time)  Medications Ordered in UC Medications  dexamethasone (DECADRON) injection 10 mg (10 mg Intramuscular Given 10/21/21 1823)    Initial Impression / Assessment and Plan / UC Course  I have reviewed the triage vital signs and the nursing notes.  Pertinent labs & imaging results that were available during my care of the patient were reviewed by me and considered in my medical decision making (see chart for details).     Will treat with IM Decadron, Flexeril, anti-inflammatory pain medications, stretches, heat, rest.  Work note given.  Sports med follow-up for worsening symptoms or unresolving  symptoms.  Final Clinical Impressions(s) / UC Diagnoses   Final diagnoses:  Acute bilateral low back pain with bilateral sciatica   Discharge Instructions   None    ED Prescriptions     Medication  Sig Dispense Auth. Provider   naproxen (NAPROSYN) 500 MG tablet Take 1 tablet (500 mg total) by mouth 2 (two) times daily as needed. 30 tablet Volney American, [place]   cyclobenzaprine (FLEXERIL) 5 MG tablet Take 1 tablet (5 mg total) by mouth 3 (three) times daily as needed for muscle spasms. Do not drink alcohol or drive while taking this medication.  May cause drowsiness. 15 tablet Volney American, [place]      PDMP not reviewed this encounter.   Volney American, [place] 10/21/21 1849

## 2021-12-16 ENCOUNTER — Encounter (HOSPITAL_COMMUNITY): Payer: Self-pay | Admitting: *Deleted

## 2021-12-16 ENCOUNTER — Other Ambulatory Visit: Payer: Self-pay

## 2021-12-16 ENCOUNTER — Emergency Department (HOSPITAL_COMMUNITY)
Admission: EM | Admit: 2021-12-16 | Discharge: 2021-12-17 | Disposition: A | Discharge status: Home / Self Care | Payer: Self-pay | Attending: Emergency Medicine | Admitting: Emergency Medicine

## 2021-12-16 ENCOUNTER — Emergency Department (HOSPITAL_COMMUNITY): Payer: Self-pay

## 2021-12-16 DIAGNOSIS — D649 Anemia, unspecified: Secondary | ICD-10-CM | POA: Insufficient documentation

## 2021-12-16 DIAGNOSIS — R1030 Lower abdominal pain, unspecified: Secondary | ICD-10-CM | POA: Insufficient documentation

## 2021-12-16 DIAGNOSIS — K529 Noninfective gastroenteritis and colitis, unspecified: Secondary | ICD-10-CM | POA: Insufficient documentation

## 2021-12-16 LAB — COMPREHENSIVE METABOLIC PANEL
ALT: 15 U/L (ref 0–44)
AST: 15 U/L (ref 15–41)
Albumin: 3.1 g/dL — ABNORMAL LOW (ref 3.5–5.0)
Alkaline Phosphatase: 52 U/L (ref 38–126)
Anion gap: 5 (ref 5–15)
BUN: 9 mg/dL (ref 6–20)
CO2: 26 mmol/L (ref 22–32)
Calcium: 8.3 mg/dL — ABNORMAL LOW (ref 8.9–10.3)
Chloride: 106 mmol/L (ref 98–111)
Creatinine, Ser: 1.02 mg/dL (ref 0.61–1.24)
GFR, Estimated: >60 mL/min (ref >60)
Glucose, Bld: 109 mg/dL — ABNORMAL HIGH (ref 70–99)
Potassium: 3.6 mmol/L (ref 3.5–5.1)
Sodium: 137 mmol/L (ref 135–145)
Total Bilirubin: 0.2 mg/dL — ABNORMAL LOW (ref 0.3–1.2)
Total Protein: 5.6 g/dL — ABNORMAL LOW (ref 6.5–8.1)

## 2021-12-16 LAB — CBC WITH DIFFERENTIAL/PLATELET
Abs Immature Granulocytes: 0.03 10*3/uL (ref 0.00–0.07)
Basophils Absolute: 0 10*3/uL (ref 0.0–0.1)
Basophils Relative: 1 %
Eosinophils Absolute: 0.1 10*3/uL (ref 0.0–0.5)
Eosinophils Relative: 2 %
HCT: 37.6 % — ABNORMAL LOW (ref 39.0–52.0)
Hemoglobin: 12.7 g/dL — ABNORMAL LOW (ref 13.0–17.0)
Immature Granulocytes: 1 %
Lymphocytes Relative: 36 %
Lymphs Abs: 2.2 10*3/uL (ref 0.7–4.0)
MCH: 29.7 pg (ref 26.0–34.0)
MCHC: 33.8 g/dL (ref 30.0–36.0)
MCV: 87.9 fL (ref 80.0–100.0)
Monocytes Absolute: 0.5 10*3/uL (ref 0.1–1.0)
Monocytes Relative: 8 %
Neutro Abs: 3.2 10*3/uL (ref 1.7–7.7)
Neutrophils Relative %: 52 %
Platelets: 332 10*3/uL (ref 150–400)
RBC: 4.28 MIL/uL (ref 4.22–5.81)
RDW: 13.4 % (ref 11.5–15.5)
WBC: 6 10*3/uL (ref 4.0–10.5)
nRBC: 0 % (ref 0.0–0.2)

## 2021-12-16 LAB — URINALYSIS, ROUTINE W REFLEX MICROSCOPIC
Bilirubin Urine: NEGATIVE
Glucose, UA: NEGATIVE mg/dL
Hgb urine dipstick: NEGATIVE
Ketones, ur: NEGATIVE mg/dL
Leukocytes,Ua: NEGATIVE
Nitrite: NEGATIVE
Protein, ur: NEGATIVE mg/dL
Specific Gravity, Urine: >1.03 — ABNORMAL HIGH (ref 1.005–1.030)
pH: 6 (ref 5.0–8.0)

## 2021-12-16 LAB — LIPASE, BLOOD: Lipase: 49 U/L (ref 11–51)

## 2021-12-16 MED ORDER — MORPHINE SULFATE (PF) 4 MG/ML IV SOLN
4.0000 mg | Freq: Once | INTRAVENOUS | Status: AC
  Administered 2021-12-17: 4 mg via INTRAVENOUS
  Filled 2021-12-16: qty 1

## 2021-12-16 MED ORDER — ONDANSETRON HCL 4 MG/2ML IJ SOLN
4.0000 mg | Freq: Once | INTRAMUSCULAR | Status: AC
  Administered 2021-12-17: 4 mg via INTRAVENOUS
  Filled 2021-12-16: qty 2

## 2021-12-16 MED ORDER — IOHEXOL 300 MG/ML  SOLN
100.0000 mL | Freq: Once | INTRAMUSCULAR | Status: AC | PRN
  Administered 2021-12-16: 100 mL via INTRAVENOUS

## 2021-12-16 MED ORDER — SODIUM CHLORIDE 0.9 % IV BOLUS
1000.0000 mL | Freq: Once | INTRAVENOUS | Status: AC
  Administered 2021-12-17: 1000 mL via INTRAVENOUS

## 2021-12-16 NOTE — ED Provider Triage Note (Signed)
Emergency Medicine Provider Triage Evaluation Note  James Sexton , a 46 y.o. male  was evaluated in triage.  Pt complains of Abdominal pain for three days.  He reports he is having about 2 episodes of vomiting over 4 days.  He has been having about 6-8 times a day.   He reports He has not had any fevers.  No localized abdominal pain.  He states that he has pain that migrates around his abdomen.     Review of Systems  Positive: See above.  Negative:   Physical Exam  BP (!) 135/59    Pulse 76    Temp 99.3 F (37.4 C)    Resp 16    Ht 5\' 8"  (1.727 m)    Wt 79.7 kg    SpO2 98%    BMI 26.72 kg/m  Gen:   Awake, no distress   Resp:  Normal effort  MSK:   Moves extremities without difficulty  Other:  Appears uncomfortable   Medical Decision Making  Medically screening exam initiated at 9:55 PM.  Appropriate orders placed.  was informed that the remainder of the evaluation will be completed by another provider, this initial triage assessment does not replace that evaluation, and the importance of remaining in the ED until their evaluation is complete.  Note: Portions of this report may have been transcribed using voice recognition software. Every effort was made to ensure accuracy; however, inadvertent computerized transcription errors may be present    Demetrio Lapping, PA-C 12/16/21 2201

## 2021-12-16 NOTE — ED Triage Notes (Signed)
The pt suddenly turned back up form outside  c/o abd pain  with constipation since Friday with nausea and vomiting

## 2021-12-16 NOTE — ED Triage Notes (Signed)
No answer x3

## 2021-12-16 NOTE — ED Notes (Signed)
Pt seen walking out of hospital lobby

## 2021-12-16 NOTE — ED Triage Notes (Signed)
No answer when called 

## 2021-12-16 NOTE — ED Notes (Signed)
Pt had left ED to "go take care of something" and just returned.

## 2021-12-16 NOTE — ED Notes (Signed)
Pt has returned.  

## 2021-12-17 ENCOUNTER — Encounter (HOSPITAL_COMMUNITY): Payer: Self-pay | Admitting: Student

## 2021-12-17 MED ORDER — METRONIDAZOLE 500 MG PO TABS: 500.0000 mg | ORAL_TABLET | Freq: Three times a day (TID) | ORAL | 0 refills | Status: DC

## 2021-12-17 MED ORDER — ONDANSETRON 4 MG PO TBDP: 4.0000 mg | ORAL_TABLET | Freq: Three times a day (TID) | ORAL | 0 refills | Status: DC | PRN

## 2021-12-17 MED ORDER — CIPROFLOXACIN HCL 500 MG PO TABS: 500.0000 mg | ORAL_TABLET | Freq: Two times a day (BID) | ORAL | 0 refills | Status: DC

## 2021-12-17 MED ORDER — PANTOPRAZOLE SODIUM 20 MG PO TBEC: 20.0000 mg | DELAYED_RELEASE_TABLET | Freq: Every day | ORAL | 0 refills | Status: AC

## 2021-12-17 NOTE — ED Provider Notes (Signed)
[facility] Provider Note   CSN: [id number] Arrival date & time: 12/16/21  2035     History  Chief Complaint  Patient presents with   Abdominal Pain    James Sexton is a 46 y.o. male without significant pmhx who presents to the ED with complaints of abdominal pain x 1 week. Periumbilical and lower abdomen, constant, worse with certain movements and when eating, no alleviating factors. Having associated N/V, last BM was about an hour ago and was firm- no blood. Denies fever, hematemesis, melena, dysuria, diarrhea or testicular pain/swelling.   HPI     Home Medications Prior to Admission medications   Medication Sig Start Date End Date Taking? Authorizing Provider  albuterol (VENTOLIN HFA) 108 (90 Base) MCG/ACT inhaler Inhale 1-2 puffs into the lungs every 6 (six) hours as needed for wheezing or shortness of breath. 07/30/19   Ripley Fraise, MD  ciprofloxacin (CIPRO) 500 MG tablet Take 1 tablet (500 mg total) by mouth 2 (two) times daily. One po bid x 7 days 07/29/21   Daleen Bo, MD  cyclobenzaprine (FLEXERIL) 10 MG tablet Take 1 tablet (10 mg total) by mouth 2 (two) times daily as needed for muscle spasms. 11/06/20   Loura Halt A, NP  cyclobenzaprine (FLEXERIL) 5 MG tablet Take 1 tablet (5 mg total) by mouth 3 (three) times daily as needed for muscle spasms. Do not drink alcohol or drive while taking this medication.  May cause drowsiness. 10/21/21   Volney American, PA-C  HYDROcodone-acetaminophen (NORCO) 5-325 MG tablet Take 1 tablet by mouth every 4 (four) hours as needed for moderate pain or severe pain. 07/29/21   Daleen Bo, MD  ibuprofen (ADVIL) 800 MG tablet Take 1 tablet (800 mg total) by mouth 3 (three) times daily. 11/06/20   Bast, Tressia Miners A, NP  lidocaine (XYLOCAINE) 2 % solution Use as directed 15 mLs in the mouth or throat as needed for mouth pain. 09/14/19   Joy, Shawn C, PA-C  metroNIDAZOLE (FLAGYL) 500 MG tablet  Take 1 tablet (500 mg total) by mouth 2 (two) times daily. One po bid x 7 days 07/29/21   Daleen Bo, MD  naproxen (NAPROSYN) 500 MG tablet Take 1 tablet (500 mg total) by mouth 2 (two) times daily as needed. 10/21/21   Volney American, PA-C      Allergies    Penicillins    Review of Systems   Review of Systems  Constitutional:  Negative for chills and fever.  Respiratory:  Negative for shortness of breath.   Cardiovascular:  Negative for chest pain.  Gastrointestinal:  Positive for abdominal pain, constipation, nausea and vomiting.  Genitourinary:  Negative for dysuria, scrotal swelling and testicular pain.  Neurological:  Negative for syncope.  All other systems reviewed and are negative.  Physical Exam Updated Vital Signs BP 127/85    Pulse 63    Temp 99.3 F (37.4 C)    Resp 18    Ht 5\' 8"  (1.727 m)    Wt 79.7 kg    SpO2 98%    BMI 26.72 kg/m  Physical Exam Vitals and nursing note reviewed.  Constitutional:      General: He is not in acute distress.    Appearance: He is well-developed. He is not toxic-appearing.  HENT:     Head: Normocephalic and atraumatic.  Eyes:     General:        Right eye: No discharge.  Left eye: No discharge.     Conjunctiva/sclera: Conjunctivae normal.  Cardiovascular:     Rate and Rhythm: Normal rate and regular rhythm.  Pulmonary:     Effort: Pulmonary effort is normal. No respiratory distress.     Breath sounds: Normal breath sounds. No wheezing, rhonchi or rales.  Abdominal:     General: There is no distension.     Palpations: Abdomen is soft.     Tenderness: There is abdominal tenderness in the right lower quadrant, periumbilical area and left lower quadrant.  Musculoskeletal:     Cervical back: Neck supple.  Skin:    General: Skin is warm and dry.     Findings: No rash.  Neurological:     Mental Status: He is alert.     Comments: Clear speech.   Psychiatric:        Behavior: Behavior normal.    ED Results /  Procedures / Treatments   Labs (all labs ordered are listed, but only abnormal results are displayed) Labs Reviewed  COMPREHENSIVE METABOLIC PANEL - Abnormal; Notable for the following components:      Result Value   Glucose, Bld 109 (*)    Calcium 8.3 (*)    Total Protein 5.6 (*)    Albumin 3.1 (*)    Total Bilirubin 0.2 (*)    All other components within normal limits  CBC WITH DIFFERENTIAL/PLATELET - Abnormal; Notable for the following components:   Hemoglobin 12.7 (*)    HCT 37.6 (*)    All other components within normal limits  URINALYSIS, ROUTINE W REFLEX MICROSCOPIC - Abnormal; Notable for the following components:   Specific Gravity, Urine >1.030 (*)    All other components within normal limits  LIPASE, BLOOD    EKG None  Radiology CT Abdomen Pelvis W Contrast  Result Date: 12/17/2021 CLINICAL DATA:  LLQ abdominal pain EXAM: CT ABDOMEN AND PELVIS WITH CONTRAST TECHNIQUE: Multidetector CT imaging of the abdomen and pelvis was performed using the standard protocol following bolus administration of intravenous contrast. RADIATION DOSE REDUCTION: This exam was performed according to the departmental dose-optimization program which includes automated exposure control, adjustment of the mA and/or kV according to patient size and/or use of iterative reconstruction technique. CONTRAST:  170mL OMNIPAQUE IOHEXOL 300 MG/ML  SOLN COMPARISON:  CT renal 07/29/2021, CT abdomen pelvis 10/22/2009 FINDINGS: Lower chest: No acute abnormality. Hepatobiliary: Similar-appearing 2.2 cm fluid dense lesion along the right hepatic dome likely representing a cystic lesion. Otherwise no focal liver abnormality. No gallstones, gallbladder wall thickening, or pericholecystic fluid. No biliary dilatation. Pancreas: No focal lesion. Normal pancreatic contour. No surrounding inflammatory changes. No main pancreatic ductal dilatation. Spleen: Normal in size without focal abnormality. Adrenals/Urinary Tract: No  adrenal nodule bilaterally. Bilateral kidneys enhance symmetrically. No hydronephrosis. No hydroureter. The urinary bladder is unremarkable. Stomach/Bowel: Stomach is within normal limits. No evidence of small bowel wall thickening or dilatation. No large bowel dilatation. Ascending colon and hepatic flexure, proximal descending, rectal colon bowel wall thickening likely due to under distension. No associated pericolonic fat stranding. Appendix appears normal. Vascular/Lymphatic: No abdominal aorta or iliac aneurysm. Mild atherosclerotic plaque of the aorta and its branches. No abdominal, pelvic, or inguinal lymphadenopathy. Reproductive: Prostate is unremarkable. Other: No intraperitoneal free fluid. No intraperitoneal free gas. No organized fluid collection. Musculoskeletal: No abdominal wall hernia or abnormality. No suspicious lytic or blastic osseous lesions. No acute displaced fracture. Multilevel degenerative changes of the spine. Bilateral hip degenerative changes. IMPRESSION: 1. Ascending colon and hepatic  flexure, proximal descending, rectal colon bowel wall thickening likely due to under distension. Developing colitis not fully excluded. Correlate clinically. 2. Otherwise no acute intra-abdominal or intrapelvic abnormality. Electronically Signed   By: Iven Finn M.D.   On: 12/17/2021 00:22    Procedures Procedures    Medications Ordered in ED Medications  sodium chloride 0.9 % bolus 1,000 mL (1,000 mLs Intravenous New Bag/Given 12/17/21 0007)  ondansetron (ZOFRAN) injection 4 mg (4 mg Intravenous Given 12/17/21 0002)  morphine 4 MG/ML injection 4 mg (4 mg Intravenous Given 12/17/21 0002)  iohexol (OMNIPAQUE) 300 MG/ML solution 100 mL (100 mLs Intravenous Contrast Given 12/16/21 2352)    ED Course/ Medical Decision Making/ A&P                           Medical Decision Making Amount and/or Complexity of Data Reviewed Radiology: ordered.  Risk Prescription drug  management.   Patient presents to the ED with complaints of abdominal pain, this involves an extensive number of treatment options, and is a complaint that carries with it a high risk of complications and morbidity. Nontoxic, vitals without significant abnormality, BP mildly elevated @ times. Periumbilical and lower abdominal TTP noted, no peritoneal signs.    Additional history obtained:  Additional history obtained from chart review/nursing note review.    Lab Tests:  I reviewed and interpreted labs, pertinent results include:  CBC: mild anemia- PCP recheck.  CMP: mildly low potassium & protein, no critical electrolyte derangmenet.  Lipase: WNL UA: elevated specific gravity- fluids ordered, no UTI.   Imaging Studies ordered:  I ordered imaging studies which included CT A/P,  I independently reviewed & interpreted imaging & am in agreement with radiology impression which shows: 1. Ascending colon and hepatic flexure, proximal descending, rectal colon bowel wall thickening likely due to under distension. Developing colitis not fully excluded. Correlate clinically. 2. Otherwise no acute intra-abdominal or intrapelvic abnormality.   ED Course:  Medications ordered:  I ordered medications including morphine, zofran&  fluids for supportive care.  01:58:: RE-EVAL: Patient feeling improved, tolerating PO.   - Abdominal pain-repeat abdominal exam remains without peritoneal signs, do not suspect acute surgical process such as perf, obstruction, incarcerated hernia, or appendicitis.  CT scan does show findings that could represent developing colitis, given patient's distribution of pain and ongoing with 1 week with problems with bowel movements will cover for this with antibiotics, he is penicillin allergic.  We discussed no quick movements or alcohol with Cipro and Flagyl. Discussed diet recommendations. GI follow up.   Portions of this note were generated with [employer]. Dictation  errors may occur despite best attempts at proofreading.        Final Clinical Impression(s) / ED Diagnoses Final diagnoses:  Lower abdominal pain  Colitis  Anemia, unspecified type    Rx / DC Orders ED Discharge Orders          Ordered    ciprofloxacin (CIPRO) 500 MG tablet  2 times daily        12/17/21 0203    metroNIDAZOLE (FLAGYL) 500 MG tablet  3 times daily        12/17/21 0203    ondansetron (ZOFRAN-ODT) 4 MG disintegrating tablet  Every 8 hours PRN        12/17/21 0203    pantoprazole (PROTONIX) 20 MG tablet  Daily        12/17/21 0203  Leafy Kindle 12/17/21 [id number]    Merrily Pew, MD 12/17/21 [phone]

## 2021-12-17 NOTE — ED Notes (Signed)
RN gave pt apple juice to drink. Pt tolerated well and tech gave more for pt to drink.

## 2021-12-17 NOTE — Discharge Instructions (Signed)
You were seen in the emergency department today for abdominal pain.  Your labs show that she had some mild anemia and that your calcium and protein levels were mildly low, have these rechecked by your primary care provider.  Your urine showed findings of dehydration therefore you were given fluids, please be sure to stay well-hydrated.  Your CT scan showed findings of possible developing colitis, we are treating this with antibiotics, ciprofloxacin and Flagyl, please take these as prescribed.  Do not drink alcohol with Flagyl and do not participate in sports, quick movements, or exercise while taking ciprofloxacin as opposed to wrist for tendon injury.  We have prescribed you new medication(s) today. Discuss the medications prescribed today with your pharmacist as they can have adverse effects and interactions with your other medicines including over the counter and prescribed medications. Seek medical evaluation if you start to experience new or abnormal symptoms after taking one of these medicines, seek care immediately if you start to experience difficulty breathing, feeling of your throat closing, facial swelling, or rash as these could be indications of a more serious allergic reaction   Please take Protonix daily prior to meals to help with stomach acidity and take Zofran every hours as needed for nausea and vomiting.  Please follow-up with GI as well as primary care soon as possible.  Return to the emergency department for new or worsening symptoms including but not limited to new or worsening pain, fever, inability to keep fluids down, blood in your stool, dark/tarry stool, or any other concerns.

## 2022-05-29 ENCOUNTER — Ambulatory Visit (HOSPITAL_COMMUNITY)
Admission: EM | Admit: 2022-05-29 | Discharge: 2022-05-29 | Disposition: A | Discharge status: Home / Self Care | Payer: Medicaid Other | Attending: Physician Assistant | Admitting: Physician Assistant

## 2022-05-29 ENCOUNTER — Encounter (HOSPITAL_COMMUNITY): Payer: Self-pay

## 2022-05-29 ENCOUNTER — Ambulatory Visit (HOSPITAL_COMMUNITY): Payer: Medicaid Other

## 2022-05-29 DIAGNOSIS — M545 Low back pain, unspecified: Secondary | ICD-10-CM

## 2022-05-29 DIAGNOSIS — M546 Pain in thoracic spine: Secondary | ICD-10-CM

## 2022-05-29 DIAGNOSIS — M5431 Sciatica, right side: Secondary | ICD-10-CM

## 2022-05-29 MED ORDER — METHOCARBAMOL 500 MG PO TABS
500.0000 mg | ORAL_TABLET | Freq: Three times a day (TID) | ORAL | 0 refills | Status: DC | PRN
Start: 2022-05-29 — End: 2024-03-30

## 2022-05-29 MED ORDER — PREDNISONE 10 MG (21) PO TBPK: ORAL_TABLET | ORAL | 0 refills | Status: DC

## 2022-05-29 MED ORDER — KETOROLAC TROMETHAMINE 60 MG/2ML IM SOLN
60.0000 mg | Freq: Once | INTRAMUSCULAR | Status: AC
  Administered 2022-05-29: 60 mg via INTRAMUSCULAR

## 2022-05-29 MED ORDER — KETOROLAC TROMETHAMINE 60 MG/2ML IM SOLN
INTRAMUSCULAR | Status: AC
  Filled 2022-05-29: qty 2

## 2022-05-29 NOTE — ED Notes (Signed)
Patient declined xrays and provider informed.

## 2022-05-29 NOTE — Discharge Instructions (Signed)
Start prednisone tomorrow (05/30/2022).  Do not take NSAIDs with this medication including aspirin, ibuprofen/Advil, naproxen/Aleve.  You can take Robaxin/methocarbamol up to 3 times a day.  This to make you sleepy so do not drive or drink alcohol with taking it.  Use heat, rest, stretch for symptom relief.  If you have any worsening symptoms you need to return for reevaluation including if you have difficulty feeling yourself need to go to the bathroom, weakness in your legs, numbness on the inner parts of your thigh, fever.

## 2022-05-29 NOTE — ED Triage Notes (Signed)
Pt presents for right leg pain. No known injuries or falls to note.

## 2022-05-29 NOTE — ED Provider Notes (Signed)
[facility]    CSN: [id number] Arrival date & time: 05/29/22  1507      History   Chief Complaint Chief Complaint  Patient presents with   Leg Pain    HPI James Sexton is a 46 y.o. male.   Patient presents today with a several day history of severe back pain.  He has a history of recurrent back pain but states current symptoms are more extreme than previous episodes.  Pain is rated "12" on a 0-10 pain scale, throughout his thoracic and lumbar back with radiation throughout his right leg, described as burning/aching/shooting/tingling, no aggravating or alleviating factors identified.  He has tried [place] powder without improvement of symptoms.  He denies any known injury, increase in activity, recent fall, recent trauma prior to symptom onset.  Denies any bowel/bladder incontinence, lower extremity weakness, saddle anesthesia.  He does report having a plate placed in his back in the past but denies any recent spinal surgeries.  Denies history of malignancy.  He denies history of diabetes.  He is having difficulty with daily activities as a result of symptoms.    History reviewed. No pertinent past medical history.  There are no problems to display for this patient.   Past Surgical History:  Procedure Laterality Date   BACK SURGERY  01/2014   Pins and a rod       Home Medications    Prior to Admission medications   Medication Sig Start Date End Date Taking? Authorizing Provider  methocarbamol (ROBAXIN) 500 MG tablet Take 1 tablet (500 mg total) by mouth every 8 (eight) hours as needed for muscle spasms. 05/29/22  Yes Katalia Choma K, PA-C  predniSONE (STERAPRED UNI-PAK 21 TAB) 10 MG (21) TBPK tablet As directed 05/29/22  Yes Rayelle Armor K, PA-C  acetaminophen (TYLENOL) 500 MG tablet Take 1,000 mg by mouth every 6 (six) hours as needed for moderate pain or headache.    [provider]  ciprofloxacin (CIPRO) 500 MG tablet Take 1 tablet (500 mg total) by mouth  2 (two) times daily. 12/17/21   Petrucelli, Samantha R, PA-C  metroNIDAZOLE (FLAGYL) 500 MG tablet Take 1 tablet (500 mg total) by mouth 3 (three) times daily. 12/17/21   Petrucelli, Samantha R, PA-C  ondansetron (ZOFRAN-ODT) 4 MG disintegrating tablet Take 1 tablet (4 mg total) by mouth every 8 (eight) hours as needed for nausea or vomiting. 12/17/21   Petrucelli, Samantha R, PA-C  pantoprazole (PROTONIX) 20 MG tablet Take 1 tablet (20 mg total) by mouth daily. 12/17/21   Petrucelli, Pleas Koch, PA-C    Family History Family History  Problem Relation Age of Onset   Hypertension Mother    Diabetes Mother    Hypertension Father    Diabetes Father     Social History Social History   Tobacco Use   Smoking status: Never   Smokeless tobacco: Never  Vaping Use   Vaping Use: Never used  Substance Use Topics   Alcohol use: Yes   Drug use: Yes    Frequency: 7.0 times per week    Types: Marijuana     Allergies   Penicillins   Review of Systems Review of Systems  Constitutional:  Positive for activity change. Negative for appetite change, fatigue and fever.  Gastrointestinal:  Negative for abdominal pain, diarrhea, nausea and vomiting.  Genitourinary:  Negative for dysuria, frequency and urgency.  Musculoskeletal:  Positive for arthralgias, back pain and myalgias.  Neurological:  Negative for dizziness, weakness, light-headedness,  numbness and headaches.     Physical Exam Triage Vital Signs ED Triage Vitals [05/29/22 1607]  Enc Vitals Group     BP 104/69     Pulse Rate 74     Resp 16     Temp 98.5 F (36.9 C)     Temp Source Oral     SpO2 98 %     Weight      Height      Head Circumference      Peak Flow      Pain Score      Pain Loc      Pain Edu?      Excl. in GC?    No data found.  Updated Vital Signs BP 104/69 (BP Location: Left Arm)   Pulse 74   Temp 98.5 F (36.9 C) (Oral)   Resp 16   SpO2 98%   Visual Acuity Right Eye Distance:   Left Eye  Distance:   Bilateral Distance:    Right Eye Near:   Left Eye Near:    Bilateral Near:     Physical Exam Vitals reviewed.  Constitutional:      General: He is awake.     Appearance: Normal appearance. He is well-developed. He is not ill-appearing.     Comments: Very pleasant male appears stated age in no acute distress sitting comfortably in exam room  HENT:     Head: Normocephalic and atraumatic.     Mouth/Throat:     Pharynx: Uvula midline. No oropharyngeal exudate or posterior oropharyngeal erythema.  Cardiovascular:     Rate and Rhythm: Normal rate and regular rhythm.     Heart sounds: Normal heart sounds, S1 normal and S2 normal. No murmur heard. Pulmonary:     Effort: Pulmonary effort is normal.     Breath sounds: Normal breath sounds. No stridor. No wheezing, rhonchi or rales.     Comments: Clear to auscultation bilaterally Abdominal:     General: Bowel sounds are normal.     Palpations: Abdomen is soft.     Tenderness: There is no abdominal tenderness.  Musculoskeletal:     Cervical back: No tenderness or bony tenderness.     Thoracic back: Tenderness and bony tenderness present.     Lumbar back: Tenderness and bony tenderness present. Positive right straight leg raise test. Negative left straight leg raise test.     Comments: Pain percussion of vertebrae from thoracic spine throughout lumbar spine.  No deformity or step-off noted.  Tenderness palpation of right paraspinal muscles in the thoracic and lumbar region.  No spasm noted.  Positive straight leg raise on the right.  Patient unable to perform Pearlean Brownie on right due to severity of pain.  Neurological:     Mental Status: He is alert.  Psychiatric:        Behavior: Behavior is cooperative.      UC Treatments / Results  Labs (all labs ordered are listed, but only abnormal results are displayed) Labs Reviewed - No data to display  EKG   Radiology No results found.  Procedures Procedures (including critical  care time)  Medications Ordered in UC Medications  ketorolac (TORADOL) injection 60 mg (60 mg Intramuscular Given 05/29/22 1716)    Initial Impression / Assessment and Plan / UC Course  I have reviewed the triage vital signs and the nursing notes.  Pertinent labs & imaging results that were available during my care of the patient were reviewed by me and considered  in my medical decision making (see chart for details).     X-rays ordered given bony tenderness on exam but patient was unable to wait to have this done and so ultimately these were discontinued.  He was given 60 mg of Toradol in clinic and reports significant burning sensation in his buttocks.  A little bit of freeze ease was used which provided relief of symptoms.  He continued to have pain but is hopeful that once the medication kicks in and will provide some relief.  He was instructed not to take NSAIDs including aspirin, ibuprofen/Advil, naproxen/Aleve.  He is to start prednisone taper tomorrow with instruction not to take NSAIDs with this medication.  He can use Tylenol for additional symptom relief.  Recommended heat and gentle stretch.  He was given Robaxin/methocarbamol for additional symptom relief with instruction not to drive or drink alcohol while taking this medication as drowsiness is a common side effect.  If symptoms or not improving he is to follow-up with orthopedics.  Discussed that if he has any worsening symptoms including increased pain, weakness, numbness/paresthesias, bowel/bladder incontinence, lower extremity weakness he is to go to the emergency room immediately to which he expressed understanding.  Work excuse note provided.  Strict return precautions given.  Final Clinical Impressions(s) / UC Diagnoses   Final diagnoses:  Acute right-sided thoracic back pain  Lumbar back pain  Sciatica of right side     Discharge Instructions      Start prednisone tomorrow (05/30/2022).  Do not take NSAIDs with this  medication including aspirin, ibuprofen/Advil, naproxen/Aleve.  You can take Robaxin/methocarbamol up to 3 times a day.  This to make you sleepy so do not drive or drink alcohol with taking it.  Use heat, rest, stretch for symptom relief.  If you have any worsening symptoms you need to return for reevaluation including if you have difficulty feeling yourself need to go to the bathroom, weakness in your legs, numbness on the inner parts of your thigh, fever.     ED Prescriptions     Medication Sig Dispense Auth. Provider   predniSONE (STERAPRED UNI-PAK 21 TAB) 10 MG (21) TBPK tablet As directed 21 tablet Tahesha Skeet K, PA-C   methocarbamol (ROBAXIN) 500 MG tablet Take 1 tablet (500 mg total) by mouth every 8 (eight) hours as needed for muscle spasms. 21 tablet Lucan Riner, Noberto Retort, PA-C      PDMP not reviewed this encounter.   Jeani Hawking, PA-C 05/29/22 1734

## 2022-06-11 ENCOUNTER — Encounter (HOSPITAL_COMMUNITY): Payer: Self-pay | Admitting: Emergency Medicine

## 2022-06-11 ENCOUNTER — Ambulatory Visit (HOSPITAL_COMMUNITY)
Admission: EM | Admit: 2022-06-11 | Discharge: 2022-06-11 | Disposition: A | Discharge status: Home / Self Care | Payer: Medicaid Other | Attending: Emergency Medicine | Admitting: Emergency Medicine

## 2022-06-11 DIAGNOSIS — S0592XA Unspecified injury of left eye and orbit, initial encounter: Secondary | ICD-10-CM

## 2022-06-11 MED ORDER — TETRACAINE HCL 0.5 % OP SOLN
OPHTHALMIC | Status: AC
  Filled 2022-06-11: qty 4

## 2022-06-11 MED ORDER — EYE WASH OP SOLN
OPHTHALMIC | Status: AC
  Filled 2022-06-11: qty 118

## 2022-06-11 MED ORDER — FLUORESCEIN SODIUM 1 MG OP STRP
ORAL_STRIP | OPHTHALMIC | Status: AC
  Filled 2022-06-11: qty 1

## 2022-06-11 NOTE — Discharge Instructions (Addendum)
D/C to on-call eye specialist

## 2022-06-11 NOTE — ED Triage Notes (Signed)
Pt reports got type of cleaner in left eye little while ago at work. Reports having some pain and blurred vision with left eye.

## 2022-06-11 NOTE — ED Provider Notes (Signed)
[facility]    CSN: [id number] Arrival date & time: 06/11/22  1639     History   Chief Complaint Chief Complaint  Patient presents with   Eye Problem    HPI James Sexton is a 46 y.o. male.  Presents after getting an unknown substance in his left eye at work.  Occurred about 2 hours before presentation to urgent care.  Patient states someone was spraying a type of cleaner and it shot into his eye.  Reports redness, irritation, pain.  Reports he did irrigate the area with water for about 30 minutes. Does not know what cleaner it was. Does not wear contacts. Reports some blurry vision in the eye.  History reviewed. No pertinent past medical history.  There are no problems to display for this patient.   Past Surgical History:  Procedure Laterality Date   BACK SURGERY  01/2014   Pins and a rod    Home Medications    Prior to Admission medications   Medication Sig Start Date End Date Taking? Authorizing Provider  acetaminophen (TYLENOL) 500 MG tablet Take 1,000 mg by mouth every 6 (six) hours as needed for moderate pain or headache.    [provider]  ciprofloxacin (CIPRO) 500 MG tablet Take 1 tablet (500 mg total) by mouth 2 (two) times daily. 12/17/21   Petrucelli, Samantha R, PA-C  methocarbamol (ROBAXIN) 500 MG tablet Take 1 tablet (500 mg total) by mouth every 8 (eight) hours as needed for muscle spasms. 05/29/22   Raspet, Noberto Retort, PA-C  metroNIDAZOLE (FLAGYL) 500 MG tablet Take 1 tablet (500 mg total) by mouth 3 (three) times daily. 12/17/21   Petrucelli, Samantha R, PA-C  ondansetron (ZOFRAN-ODT) 4 MG disintegrating tablet Take 1 tablet (4 mg total) by mouth every 8 (eight) hours as needed for nausea or vomiting. 12/17/21   Petrucelli, Samantha R, PA-C  pantoprazole (PROTONIX) 20 MG tablet Take 1 tablet (20 mg total) by mouth daily. 12/17/21   Petrucelli, Pleas Koch, PA-C  predniSONE (STERAPRED UNI-PAK 21 TAB) 10 MG (21) TBPK tablet As directed 05/29/22    Raspet, Noberto Retort, PA-C    Family History Family History  Problem Relation Age of Onset   Hypertension Mother    Diabetes Mother    Hypertension Father    Diabetes Father     Social History Social History   Tobacco Use   Smoking status: Never   Smokeless tobacco: Never  Vaping Use   Vaping Use: Never used  Substance Use Topics   Alcohol use: Yes   Drug use: Yes    Frequency: 7.0 times per week    Types: Marijuana     Allergies   Penicillins   Review of Systems Review of Systems  Per HPI  Physical Exam Triage Vital Signs ED Triage Vitals  Enc Vitals Group     BP 06/11/22 1658 108/74     Pulse Rate 06/11/22 1658 73     Resp 06/11/22 1658 14     Temp 06/11/22 1658 97.9 F (36.6 C)     Temp Source 06/11/22 1658 Oral     SpO2 06/11/22 1658 93 %     Weight --      Height --      Head Circumference --      Peak Flow --      Pain Score 06/11/22 1657 10     Pain Loc --      Pain Edu? --  Excl. in GC? --    No data found.  Updated Vital Signs BP 108/74 (BP Location: Right Arm)   Pulse 73   Temp 97.9 F (36.6 C) (Oral)   Resp 14   SpO2 93%   Visual Acuity Right Eye Distance: 20/20 Left Eye Distance: 20/40 Bilateral Distance: 20/20   Physical Exam Vitals and nursing note reviewed.  Constitutional:      Appearance: He is not ill-appearing.  HENT:     Mouth/Throat:     Pharynx: Oropharynx is clear.  Eyes:     General: Vision grossly intact.        Left eye: No foreign body or discharge.     Extraocular Movements: Extraocular movements intact.     Left eye: Normal extraocular motion.     Conjunctiva/sclera:     Left eye: Left conjunctiva is injected.     Pupils: Pupils are equal, round, and reactive to light.     Comments: No fluorescein uptake on exam.  Patient has continued pain  Cardiovascular:     Rate and Rhythm: Normal rate.  Pulmonary:     Effort: Pulmonary effort is normal.  Neurological:     Mental Status: He is alert and  oriented to person, place, and time.     UC Treatments / Results  Labs (all labs ordered are listed, but only abnormal results are displayed) Labs Reviewed - No data to display  EKG  Radiology No results found.  Procedures Procedures   Medications Ordered in UC Medications - No data to display  Initial Impression / Assessment and Plan / UC Course  I have reviewed the triage vital signs and the nursing notes.  Pertinent labs & imaging results that were available during my care of the patient were reviewed by me and considered in my medical decision making (see chart for details).  Thoroughly irrigated with eyewash in clinic. Unable to check eye pH at urgent care. No fluorescein uptake noted on Woods lamp exam.  Spoke with on-call ophthalmologist Dr. Vonna Kotyk.  He requested patient be sent to his office for further evaluation at this time.  Given address and cell number of on-call doctor, patient discharged to ophthalmology care.   Final Clinical Impressions(s) / UC Diagnoses   Final diagnoses:  Left eye injury, initial encounter     Discharge Instructions      D/C to on-call eye specialist    ED Prescriptions   None    PDMP not reviewed this encounter.   Kathrine Haddock 06/11/22 1913

## 2023-02-13 IMAGING — CT CT RENAL STONE PROTOCOL
2 of 4 series · 16 of 46 positions shown, 18 images · non-contrast
Comparison: 03/30/2015

CLINICAL DATA: Left-sided flank pain for several days, initial
encounter

EXAM:
CT ABDOMEN AND PELVIS WITHOUT CONTRAST
TECHNIQUE: Multidetector CT imaging of the abdomen and pelvis was performed
following the standard protocol without IV contrast.

[Series 3: stone study 5.0 i30f 2 · axial · 0.75mm/px · z∈[-229,+156]mm · 13 of 85 slices shown, 15 images]
[im 4/85  soft-tissue]
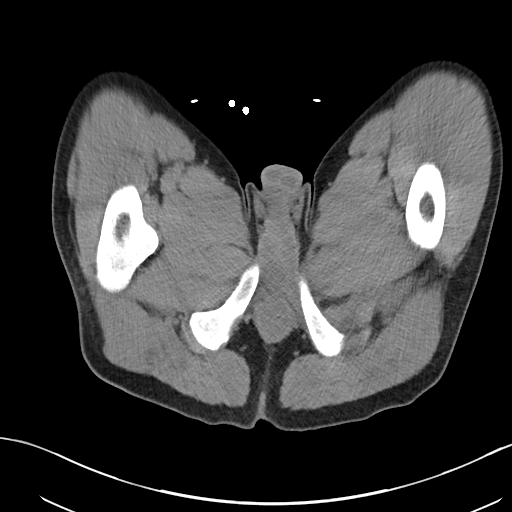
[im 4/85  bone]
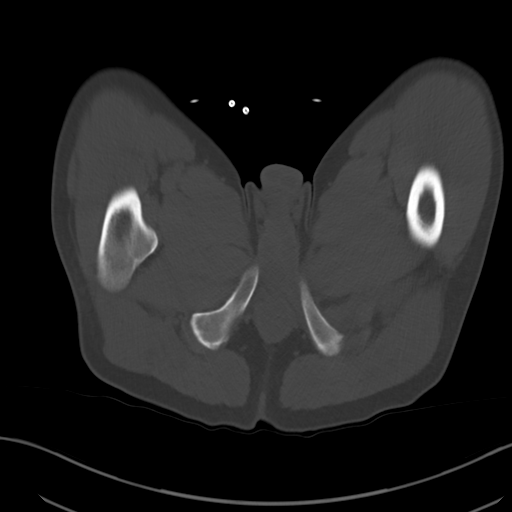
[im 11/85  soft-tissue]
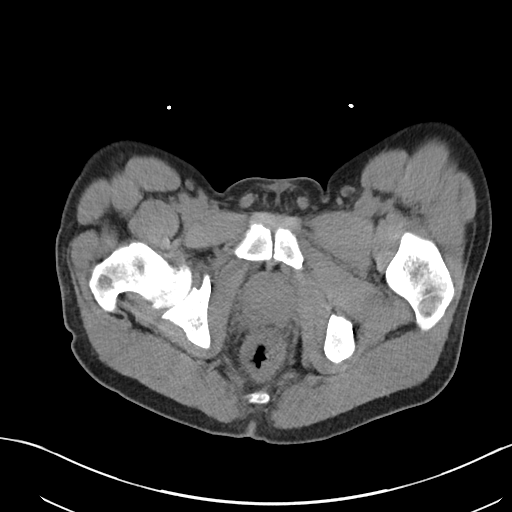
[im 17/85  soft-tissue]
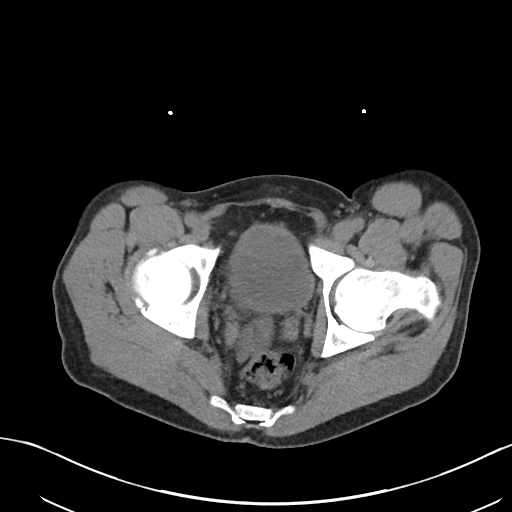
[im 24/85  soft-tissue]
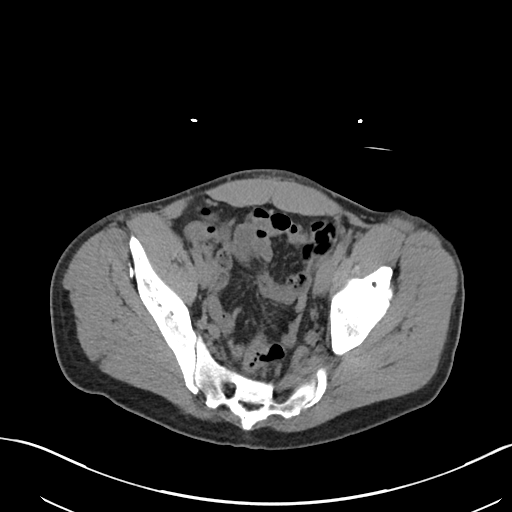
[im 31/85  soft-tissue]
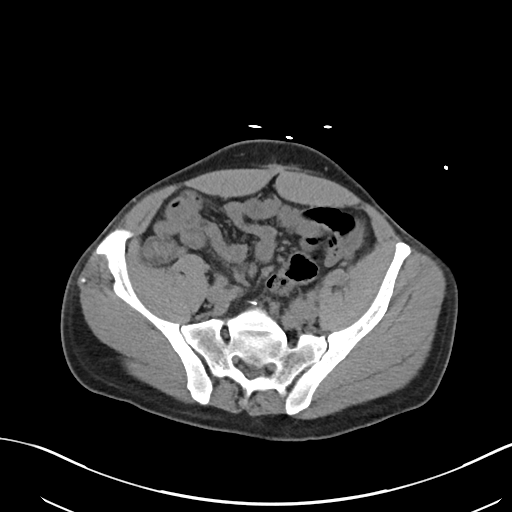
[im 37/85  soft-tissue]
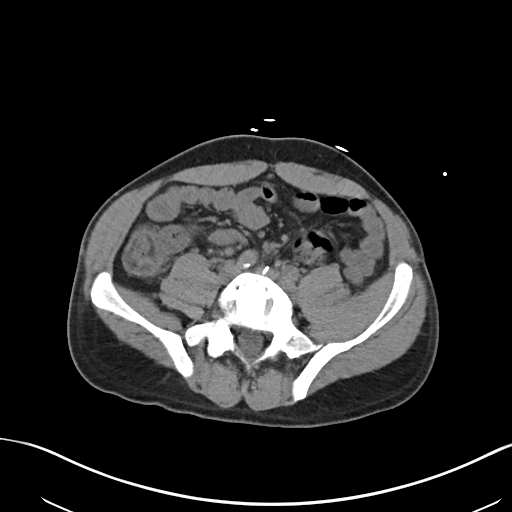
[im 44/85  soft-tissue]
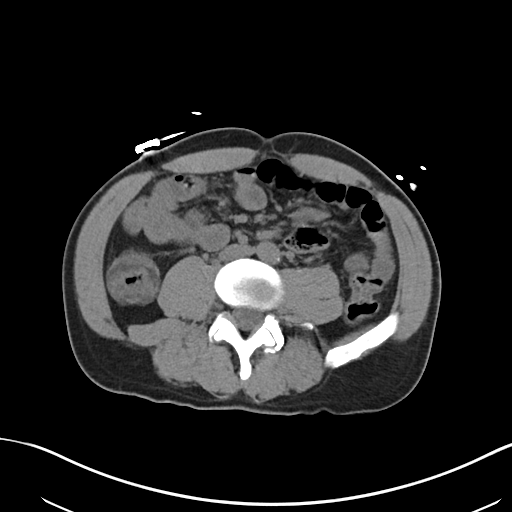
[im 48/85  soft-tissue]
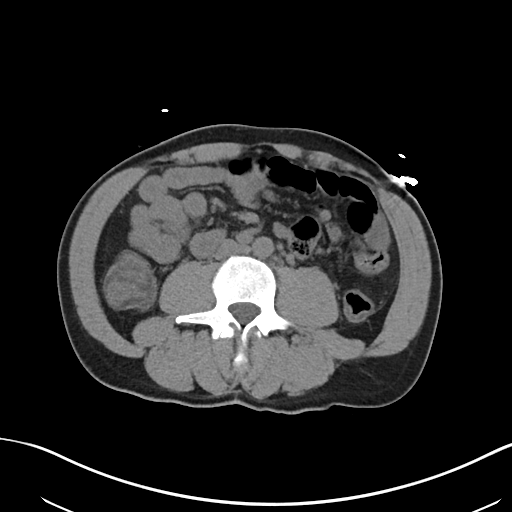
[im 54/85  soft-tissue]
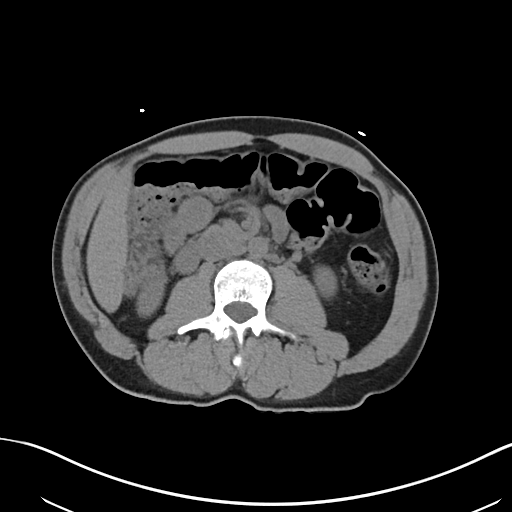
[im 54/85  bone]
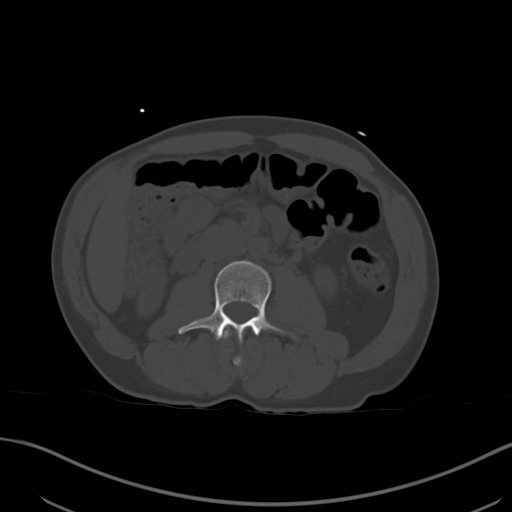
[im 61/85  soft-tissue]
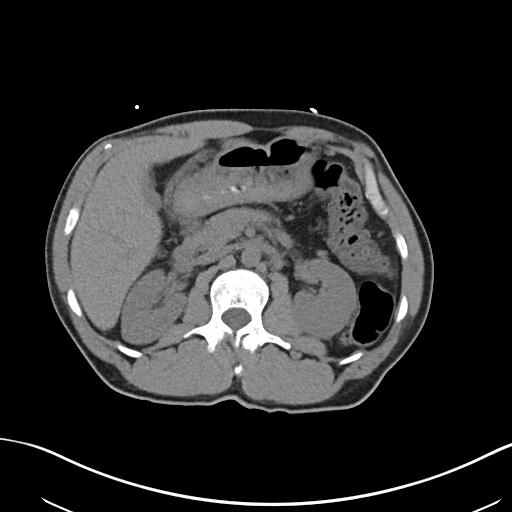
[im 68/85  soft-tissue]
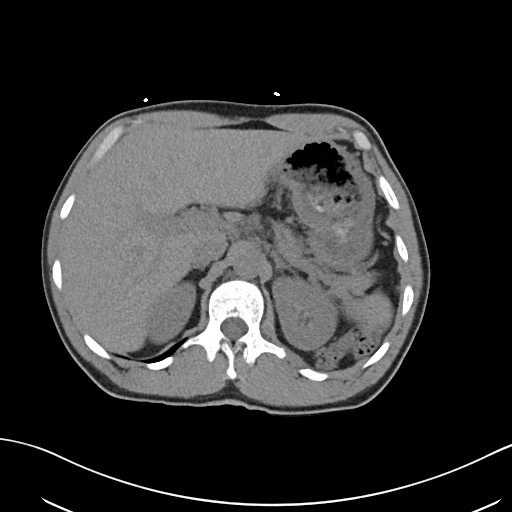
[im 74/85  soft-tissue]
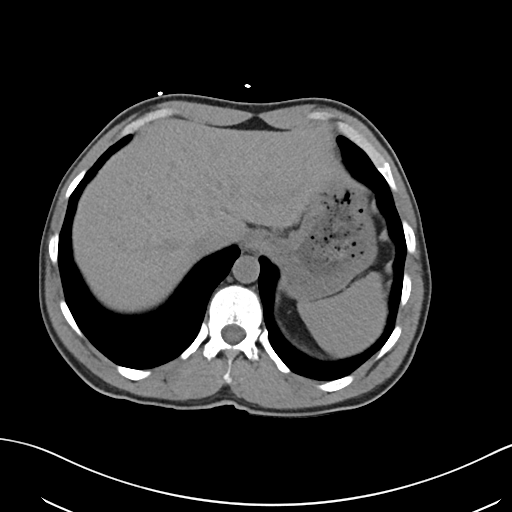
[im 81/85  soft-tissue]
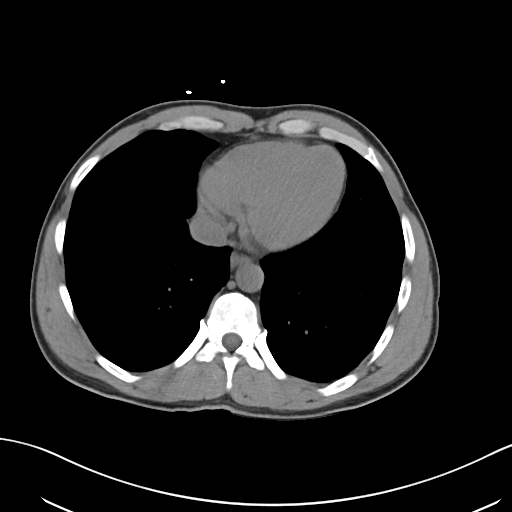

[Series 7: coronal soft tissue · coronal · 0.80mm/px · 3 of 83 slices shown]
[im 28/83  soft-tissue]
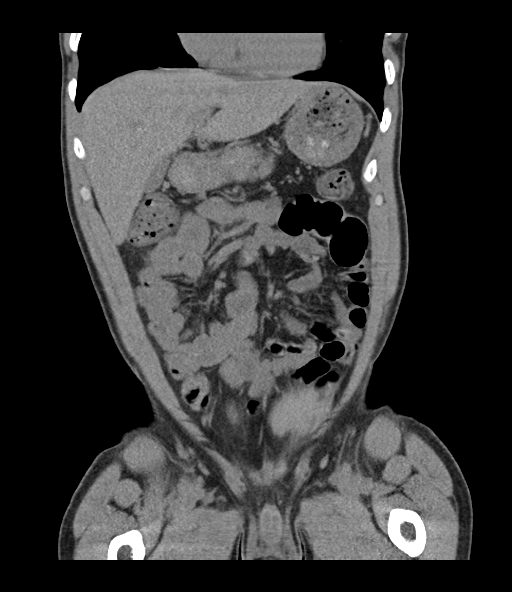
[im 37/83  soft-tissue]
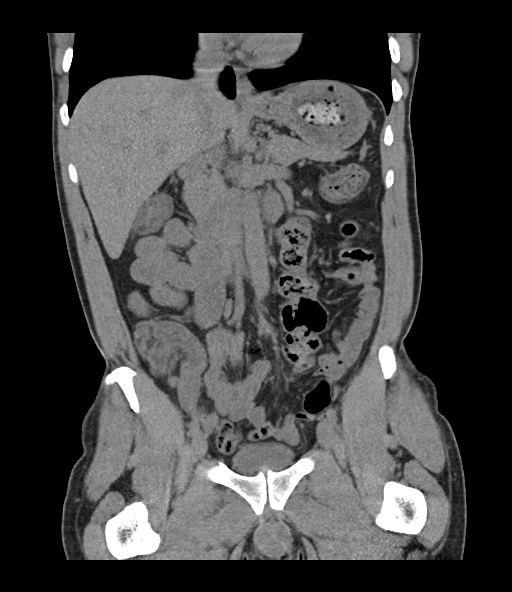
[im 46/83  soft-tissue]
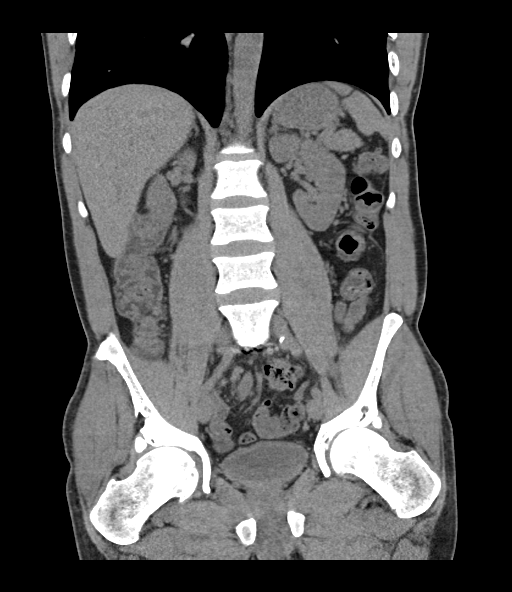

[16 of 46 positions shown; findings below may reference images not displayed]

FINDINGS: Lower chest: Calcified granuloma is noted in the left lower lobe. No
other focal abnormality is seen.

Hepatobiliary: Right hepatic cyst is noted stable in appearance from
the prior exam. Gallbladder is decompressed.

Pancreas: Unremarkable. No pancreatic ductal dilatation or
surrounding inflammatory changes.

Spleen: Normal in size without focal abnormality.

Adrenals/Urinary Tract: Adrenal glands are within normal limits.
Kidneys are well visualized bilaterally. No renal calculi are noted.
No obstructive changes are seen. Bladder is decompressed.

Stomach/Bowel: No obstructive changes are noted within the colon.
There are edematous changes and mild pericolonic inflammatory change
in the ascending colon consistent with focal colitis. No abscess is
seen. Similar findings are noted within the terminal ileum. This
raises suspicion for underlying inflammatory bowel disease. The
appendix is within normal limits. Proximal small bowel and stomach
are unremarkable.

Vascular/Lymphatic: Aortic atherosclerosis. No enlarged abdominal or
pelvic lymph nodes.

Reproductive: Prostate is unremarkable.

Other: No abdominal wall hernia or abnormality. No abdominopelvic
ascites.

Musculoskeletal: Postsurgical changes are noted in the lower lumbar
spine at L5-S1.
IMPRESSION: Inflammatory changes involving the distal aspect of the terminal
ileum as well as the ascending colon suspicious for inflammatory
bowel disease. No abscess or perforation is noted. Direct
visualization may be helpful.

No renal calculi or obstructive changes are noted.

Normal-appearing appendix.

Stable right hepatic cyst.

## 2023-03-08 ENCOUNTER — Encounter (HOSPITAL_COMMUNITY): Payer: Self-pay

## 2023-03-08 ENCOUNTER — Emergency Department (HOSPITAL_COMMUNITY)
Admission: EM | Admit: 2023-03-08 | Discharge: 2023-03-08 | Disposition: A | Discharge status: Home / Self Care | Payer: Medicaid Other | Attending: Emergency Medicine | Admitting: Emergency Medicine

## 2023-03-08 ENCOUNTER — Emergency Department (HOSPITAL_COMMUNITY): Payer: Medicaid Other

## 2023-03-08 ENCOUNTER — Other Ambulatory Visit: Payer: Self-pay

## 2023-03-08 DIAGNOSIS — R1084 Generalized abdominal pain: Secondary | ICD-10-CM | POA: Diagnosis not present

## 2023-03-08 DIAGNOSIS — R197 Diarrhea, unspecified: Secondary | ICD-10-CM | POA: Insufficient documentation

## 2023-03-08 DIAGNOSIS — R112 Nausea with vomiting, unspecified: Secondary | ICD-10-CM

## 2023-03-08 DIAGNOSIS — K529 Noninfective gastroenteritis and colitis, unspecified: Secondary | ICD-10-CM

## 2023-03-08 LAB — URINALYSIS, ROUTINE W REFLEX MICROSCOPIC
Bacteria, UA: NONE SEEN
Bilirubin Urine: NEGATIVE
Glucose, UA: NEGATIVE mg/dL
Hgb urine dipstick: NEGATIVE
Ketones, ur: NEGATIVE mg/dL
Nitrite: NEGATIVE
Protein, ur: 30 mg/dL — AB
Specific Gravity, Urine: 1.036 — ABNORMAL HIGH (ref 1.005–1.030)
pH: 5 (ref 5.0–8.0)

## 2023-03-08 LAB — CBC WITH DIFFERENTIAL/PLATELET
Abs Immature Granulocytes: 0.03 10*3/uL (ref 0.00–0.07)
Basophils Absolute: 0 10*3/uL (ref 0.0–0.1)
Basophils Relative: 0 %
Eosinophils Absolute: 0.1 10*3/uL (ref 0.0–0.5)
Eosinophils Relative: 2 %
HCT: 42.3 % (ref 39.0–52.0)
Hemoglobin: 14.1 g/dL (ref 13.0–17.0)
Immature Granulocytes: 0 %
Lymphocytes Relative: 29 %
Lymphs Abs: 2 10*3/uL (ref 0.7–4.0)
MCH: 29.3 pg (ref 26.0–34.0)
MCHC: 33.3 g/dL (ref 30.0–36.0)
MCV: 87.8 fL (ref 80.0–100.0)
Monocytes Absolute: 0.5 10*3/uL (ref 0.1–1.0)
Monocytes Relative: 8 %
Neutro Abs: 4.2 10*3/uL (ref 1.7–7.7)
Neutrophils Relative %: 61 %
Platelets: 272 10*3/uL (ref 150–400)
RBC: 4.82 MIL/uL (ref 4.22–5.81)
RDW: 13.8 % (ref 11.5–15.5)
WBC: 6.8 10*3/uL (ref 4.0–10.5)
nRBC: 0 % (ref 0.0–0.2)

## 2023-03-08 LAB — BASIC METABOLIC PANEL
Anion gap: 8 (ref 5–15)
BUN: 15 mg/dL (ref 6–20)
CO2: 24 mmol/L (ref 22–32)
Calcium: 8.7 mg/dL — ABNORMAL LOW (ref 8.9–10.3)
Chloride: 104 mmol/L (ref 98–111)
Creatinine, Ser: 0.94 mg/dL (ref 0.61–1.24)
GFR, Estimated: >60 mL/min (ref >60)
Glucose, Bld: 106 mg/dL — ABNORMAL HIGH (ref 70–99)
Potassium: 3.5 mmol/L (ref 3.5–5.1)
Sodium: 136 mmol/L (ref 135–145)

## 2023-03-08 MED ORDER — DICYCLOMINE HCL 20 MG PO TABS: 20.0000 mg | ORAL_TABLET | Freq: Two times a day (BID) | ORAL | 0 refills | Status: DC

## 2023-03-08 MED ORDER — CIPROFLOXACIN HCL 500 MG PO TABS: 500.0000 mg | ORAL_TABLET | Freq: Two times a day (BID) | ORAL | 0 refills | Status: DC

## 2023-03-08 MED ORDER — ONDANSETRON 8 MG PO TBDP: ORAL_TABLET | ORAL | 0 refills | Status: DC

## 2023-03-08 MED ORDER — METRONIDAZOLE 500 MG PO TABS: 500.0000 mg | ORAL_TABLET | Freq: Three times a day (TID) | ORAL | 0 refills | Status: DC

## 2023-03-08 MED ORDER — DROPERIDOL 2.5 MG/ML IJ SOLN
1.2500 mg | Freq: Once | INTRAMUSCULAR | Status: AC
  Administered 2023-03-08: 1.25 mg via INTRAVENOUS
  Filled 2023-03-08: qty 2

## 2023-03-08 MED ORDER — KETOROLAC TROMETHAMINE 30 MG/ML IJ SOLN
15.0000 mg | Freq: Once | INTRAMUSCULAR | Status: AC
  Administered 2023-03-08: 15 mg via INTRAVENOUS
  Filled 2023-03-08: qty 1

## 2023-03-08 MED ORDER — SODIUM CHLORIDE 0.9 % IV BOLUS
500.0000 mL | Freq: Once | INTRAVENOUS | Status: AC
  Administered 2023-03-08: 500 mL via INTRAVENOUS

## 2023-03-08 NOTE — ED Triage Notes (Signed)
Patient c/o N/V/D for 2 days with abdominal pain.

## 2023-03-08 NOTE — ED Provider Notes (Addendum)
[place] [facility]PARTMENT AT [facility] Provider Note   CSN: [id number] Arrival date & time: 03/08/23  0349     History  Chief Complaint  Patient presents with   Abdominal Pain   Nausea   Emesis    James Sexton is a 47 y.o. male.  The history is provided by the patient.  Abdominal Pain Pain location:  Generalized Pain quality: cramping   Pain radiates to:  Does not radiate Pain severity:  Moderate Onset quality:  Gradual Duration:  2 days Progression:  Waxing and waning Chronicity:  New Context: not sick contacts, not suspicious food intake and not trauma   Relieved by:  Nothing Worsened by:  Nothing Ineffective treatments:  None tried Risk factors: has not had multiple surgeries   Patient with 2 days of nausea vomiting diarrhea and cramping.    History reviewed. No pertinent past medical history.   Home Medications Prior to Admission medications   Medication Sig Start Date End Date Taking? Authorizing Provider  acetaminophen (TYLENOL) 500 MG tablet Take 1,000 mg by mouth every 6 (six) hours as needed for moderate pain or headache.    [provider]  ciprofloxacin (CIPRO) 500 MG tablet Take 1 tablet (500 mg total) by mouth 2 (two) times daily. 12/17/21   Petrucelli, Samantha R, PA-C  methocarbamol (ROBAXIN) 500 MG tablet Take 1 tablet (500 mg total) by mouth every 8 (eight) hours as needed for muscle spasms. 05/29/22   Raspet, Noberto Retort, PA-C  metroNIDAZOLE (FLAGYL) 500 MG tablet Take 1 tablet (500 mg total) by mouth 3 (three) times daily. 12/17/21   Petrucelli, Samantha R, PA-C  ondansetron (ZOFRAN-ODT) 4 MG disintegrating tablet Take 1 tablet (4 mg total) by mouth every 8 (eight) hours as needed for nausea or vomiting. 12/17/21   Petrucelli, Samantha R, PA-C  pantoprazole (PROTONIX) 20 MG tablet Take 1 tablet (20 mg total) by mouth daily. 12/17/21   Petrucelli, Samantha R, PA-C  predniSONE (STERAPRED UNI-PAK 21 TAB) 10 MG (21) TBPK tablet As  directed 05/29/22   Raspet, Erin K, PA-C      Allergies    Penicillins    Review of Systems   Review of Systems  Constitutional:  Negative for fever.  HENT:  Negative for facial swelling.   Eyes:  Negative for redness.  Respiratory:  Negative for wheezing and stridor.   Gastrointestinal:  Positive for diarrhea, nausea and vomiting.  All other systems reviewed and are negative.   Physical Exam Updated Vital Signs BP 117/79 (BP Location: Left Arm)   Pulse 64   Temp 98.1 F (36.7 C) (Oral)   Resp 18   Ht 5\' 8"  (1.727 m)   Wt 72.6 kg   SpO2 95%   BMI 24.33 kg/m  Physical Exam Vitals and nursing note reviewed. Exam conducted with a chaperone present.  Constitutional:      General: He is not in acute distress.    Appearance: He is well-developed. He is not diaphoretic.  HENT:     Head: Normocephalic and atraumatic.     Nose: Nose normal.  Eyes:     Conjunctiva/sclera: Conjunctivae normal.     Pupils: Pupils are equal, round, and reactive to light.  Cardiovascular:     Rate and Rhythm: Normal rate and regular rhythm.     Pulses: Normal pulses.     Heart sounds: Normal heart sounds.  Pulmonary:     Effort: Pulmonary effort is normal.     Breath sounds:  Normal breath sounds. No wheezing or rales.  Abdominal:     General: Bowel sounds are normal.     Palpations: Abdomen is soft.     Tenderness: There is no abdominal tenderness. There is no guarding or rebound.  Musculoskeletal:        General: Normal range of motion.     Cervical back: Normal range of motion and neck supple.  Skin:    General: Skin is warm and dry.     Capillary Refill: Capillary refill takes less than 2 seconds.  Neurological:     General: No focal deficit present.     Mental Status: He is alert and oriented to person, place, and time.     Deep Tendon Reflexes: Reflexes normal.  Psychiatric:        Mood and Affect: Mood normal.        Behavior: Behavior normal.     ED Results / Procedures /  Treatments   Labs (all labs ordered are listed, but only abnormal results are displayed) Results for orders placed or performed during the hospital encounter of 03/08/23  CBC with Differential  Result Value Ref Range   WBC 6.8 4.0 - 10.5 K/uL   RBC 4.82 4.22 - 5.81 MIL/uL   Hemoglobin 14.1 13.0 - 17.0 g/dL   HCT 16.142.3 09.639.0 - 04.552.0 %   MCV 87.8 80.0 - 100.0 fL   MCH 29.3 26.0 - 34.0 pg   MCHC 33.3 30.0 - 36.0 g/dL   RDW 40.913.8 81.111.5 - 91.415.5 %   Platelets 272 150 - 400 K/uL   nRBC 0.0 0.0 - 0.2 %   Neutrophils Relative % 61 %   Neutro Abs 4.2 1.7 - 7.7 K/uL   Lymphocytes Relative 29 %   Lymphs Abs 2.0 0.7 - 4.0 K/uL   Monocytes Relative 8 %   Monocytes Absolute 0.5 0.1 - 1.0 K/uL   Eosinophils Relative 2 %   Eosinophils Absolute 0.1 0.0 - 0.5 K/uL   Basophils Relative 0 %   Basophils Absolute 0.0 0.0 - 0.1 K/uL   Immature Granulocytes 0 %   Abs Immature Granulocytes 0.03 0.00 - 0.07 K/uL  Basic metabolic panel  Result Value Ref Range   Sodium 136 135 - 145 mmol/L   Potassium 3.5 3.5 - 5.1 mmol/L   Chloride 104 98 - 111 mmol/L   CO2 24 22 - 32 mmol/L   Glucose, Bld 106 (H) 70 - 99 mg/dL   BUN 15 6 - 20 mg/dL   Creatinine, Ser 7.820.94 0.61 - 1.24 mg/dL   Calcium 8.7 (L) 8.9 - 10.3 mg/dL   GFR, Estimated >95>60 >62>60 mL/min   Anion gap 8 5 - 15  Urinalysis, Routine w reflex microscopic -Urine, Clean Catch  Result Value Ref Range   Color, Urine YELLOW YELLOW   APPearance CLEAR CLEAR   Specific Gravity, Urine 1.036 (H) 1.005 - 1.030   pH 5.0 5.0 - 8.0   Glucose, UA NEGATIVE NEGATIVE mg/dL   Hgb urine dipstick NEGATIVE NEGATIVE   Bilirubin Urine NEGATIVE NEGATIVE   Ketones, ur NEGATIVE NEGATIVE mg/dL   Protein, ur 30 (A) NEGATIVE mg/dL   Nitrite NEGATIVE NEGATIVE   Leukocytes,Ua TRACE (A) NEGATIVE   RBC / HPF 0-5 0 - 5 RBC/hpf   WBC, UA 6-10 0 - 5 WBC/hpf   Bacteria, UA NONE SEEN NONE SEEN   Squamous Epithelial / HPF 0-5 0 - 5 /HPF   Mucus PRESENT    No results found.  EKG  Interpretation  Date/Time:  Tuesday Savahanna Almendariz 09 2024 04:13:47 EDT Ventricular Rate:  65 PR Interval:  167 QRS Duration: 93 QT Interval:  400 QTC Calculation: 416 R Axis:   53 Text Interpretation: Sinus rhythm Confirmed by Nicanor Alcon, Domanick Cuccia ([id number]) on 03/08/2023 5:32:43 AM  Radiology No results found.  Procedures Procedures    Medications Ordered in ED Medications  droperidol (INAPSINE) 2.5 MG/ML injection 1.25 mg (1.25 mg Intravenous Given 03/08/23 0503)  sodium chloride 0.9 % bolus 500 mL (500 mLs Intravenous New Bag/Given 03/08/23 0504)  ketorolac (TORADOL) 30 MG/ML injection 15 mg (15 mg Intravenous Given 03/08/23 0503)    ED Course/ Medical Decision Making/ A&P                             Medical Decision Making Patient with 2 days of nausea vomiting and diarrhea and cramping   Amount and/or Complexity of Data Reviewed External Data Reviewed: notes.    Details: Previous notes reviewed  Labs: ordered.    Details: All labs reviewed: urine without UTI.  Normal white count 6.8, normal hemoglobin 14.1, normal platelet count.  Normal sodium 136, normal potassium 3.5, normal creatinine .94 Radiology: ordered and independent interpretation performed.    Details: No stones by me on CT by me. No free air.    Risk Prescription drug management. Risk Details: Well appearing with normal vital signs and exam.  Sleeping soundly in room upon reassessment. No further emesis or diarrhea.  CT scan is consistent with colitis. Will start cipro and flagyl. Patient given strict tendon precaution due to cipro.  No sports or running or baseball or basket ball or any sports for 14 days given.   Also no alcohol for 14 days on flagyl.  Patient verbalizes understanding of these instructions and agrees no to run or play sports or drink alcohol.  Stable for discharge.  Strict return precautions given.      Final Clinical Impression(s) / ED Diagnoses Final diagnoses:  None  Return for intractable cough,  coughing up blood, fevers > 100.4 unrelieved by medication, shortness of breath, intractable vomiting, chest pain, shortness of breath, weakness, numbness, changes in speech, facial asymmetry, abdominal pain, passing out, Inability to tolerate liquids or food, cough, altered mental status or any concerns. No signs of systemic illness or infection. The patient is nontoxic-appearing on exam and vital signs are within normal limits.  I have reviewed the triage vital signs and the nursing notes. Pertinent labs & imaging results that were available during my care of the patient were reviewed by me and considered in my medical decision making (see chart for details). After history, exam, and medical workup I feel the patient has been appropriately medically screened and is safe for discharge home. Pertinent diagnoses were discussed with the patient. Patient was given return precautions.       Edythe Riches, MD 03/08/23 [phone]

## 2023-07-03 IMAGING — CT CT ABD-PELV W/ CM
2 of 5 series · 16 of 46 positions shown, 18 images · IV contrast (agent unspecified)
Comparison: CT renal 07/29/2021, CT abdomen pelvis 10/22/2009

CLINICAL DATA: LLQ abdominal pain

EXAM:
CT ABDOMEN AND PELVIS WITH CONTRAST
TECHNIQUE: Multidetector CT imaging of the abdomen and pelvis was performed
using the standard protocol following bolus administration of
intravenous contrast.

[Series 3: a/p w/ 5mm · axial · 0.78mm/px · z∈[+691,+1096]mm · 13 of 91 slices shown, 15 images]
[im 5/91  soft-tissue]
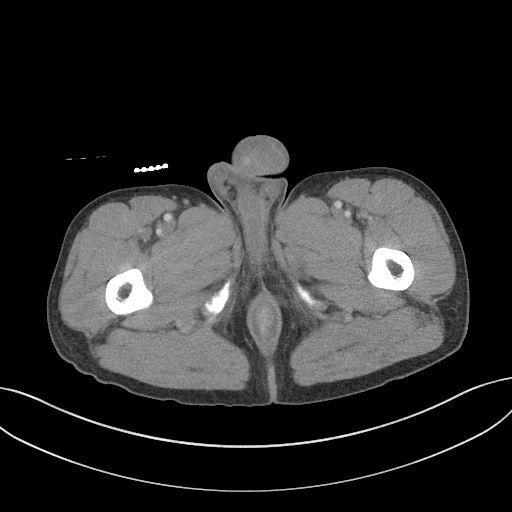
[im 5/91  bone]
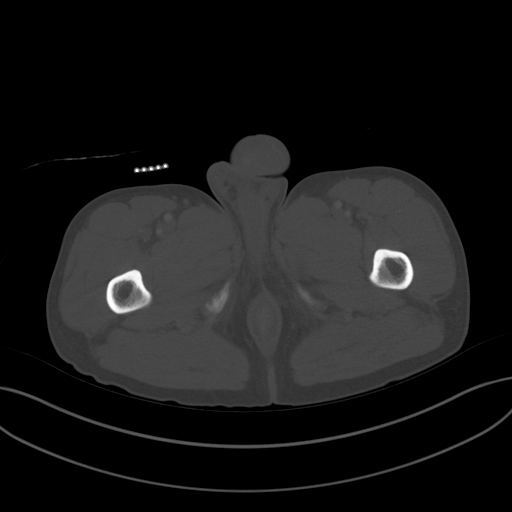
[im 15/91  soft-tissue]
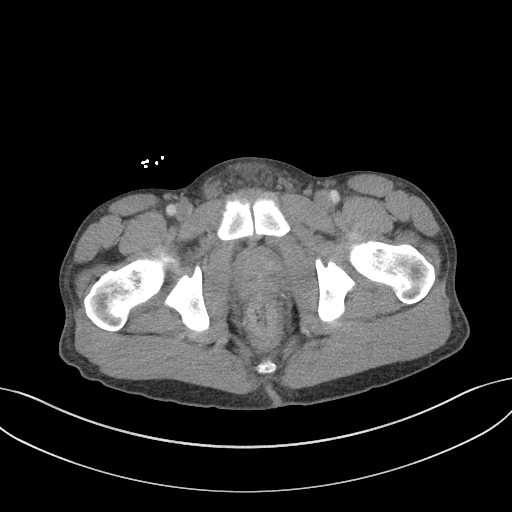
[im 19/91  soft-tissue]
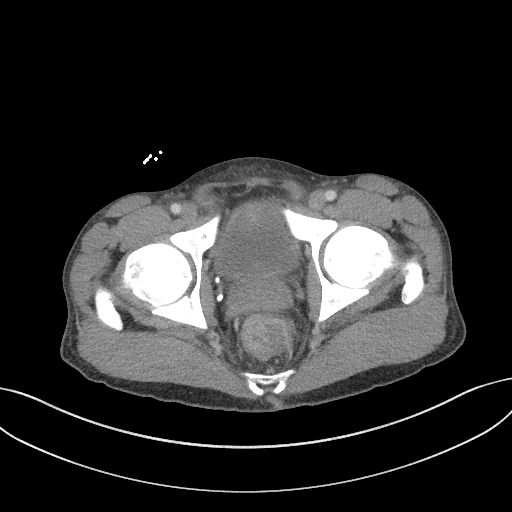
[im 24/91  soft-tissue]
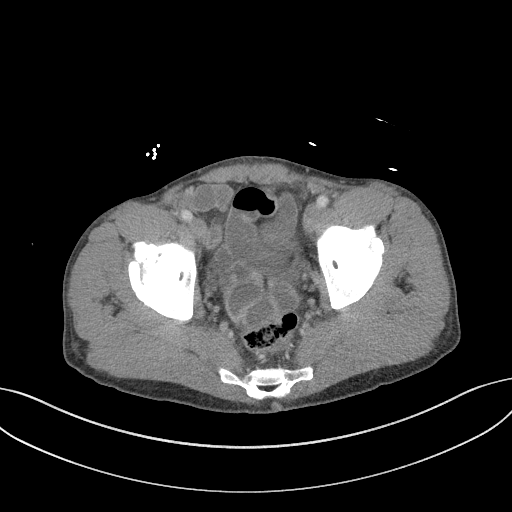
[im 34/91  soft-tissue]
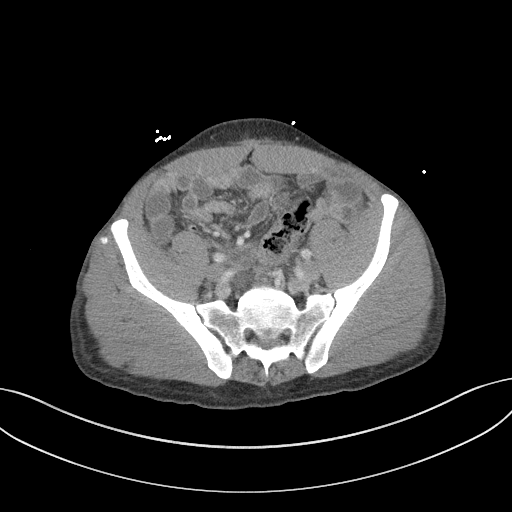
[im 38/91  soft-tissue]
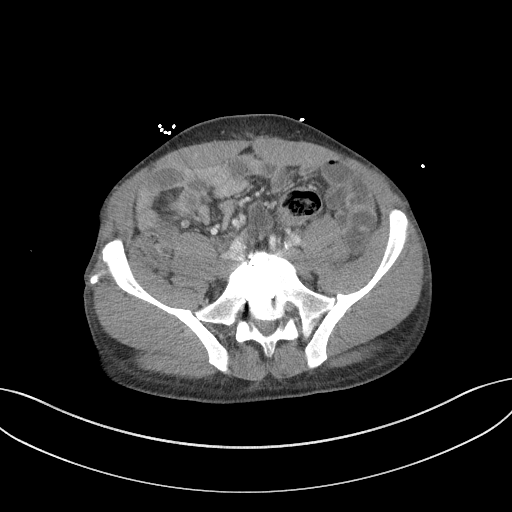
[im 48/91  soft-tissue]
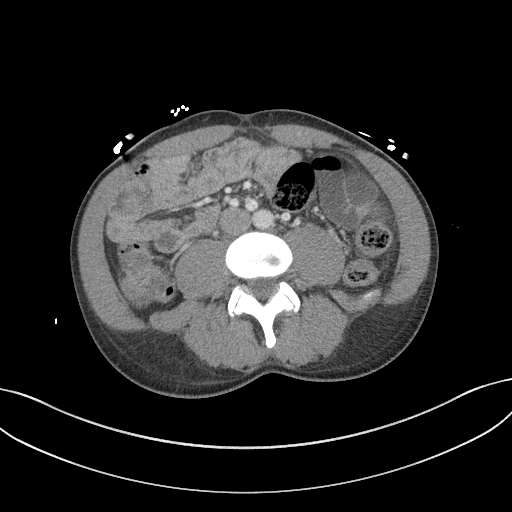
[im 53/91  soft-tissue]
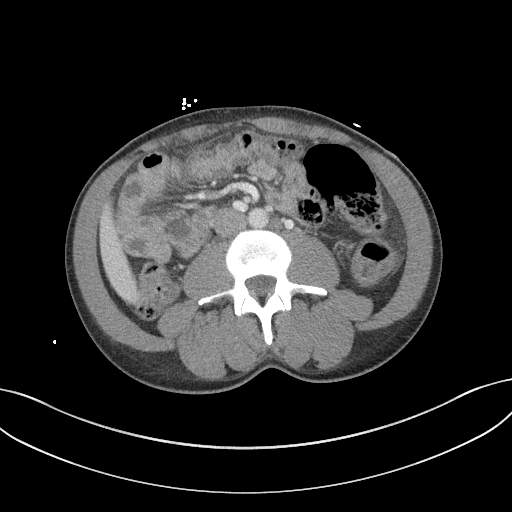
[im 57/91  soft-tissue]
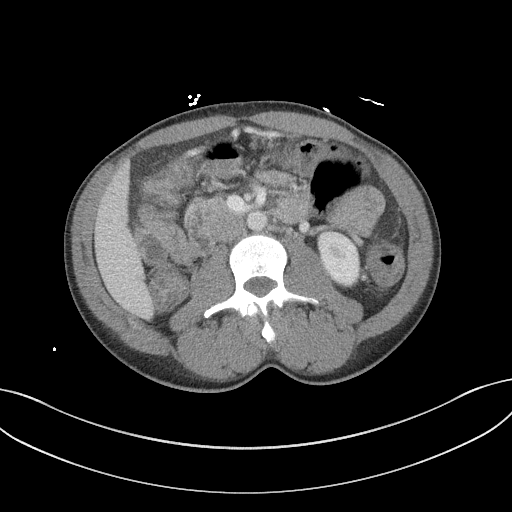
[im 57/91  bone]
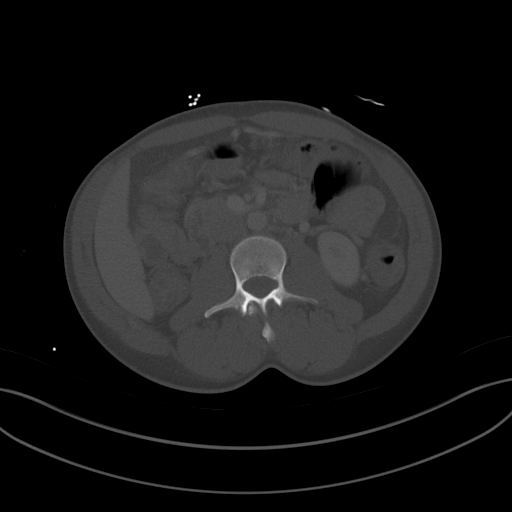
[im 67/91  soft-tissue]
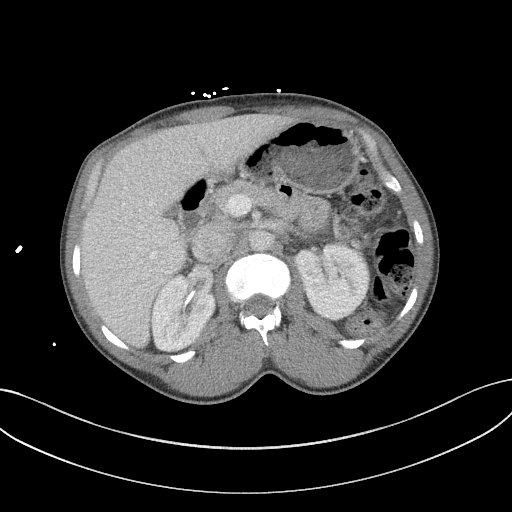
[im 72/91  soft-tissue]
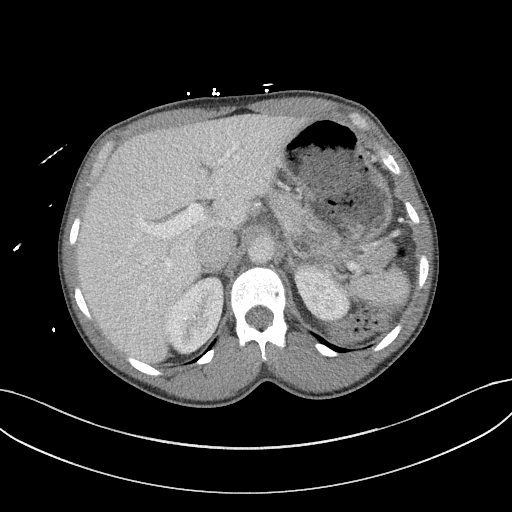
[im 76/91  soft-tissue]
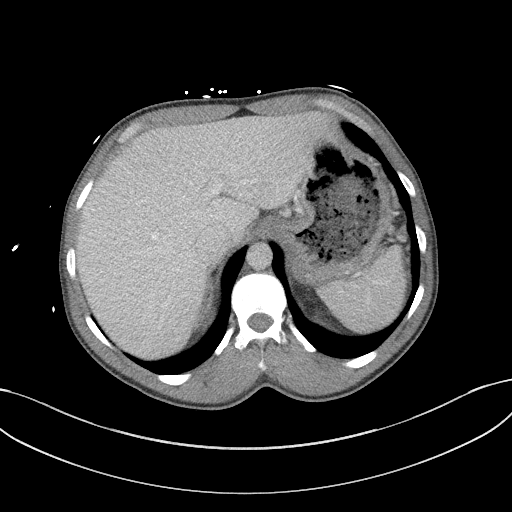
[im 86/91  soft-tissue]
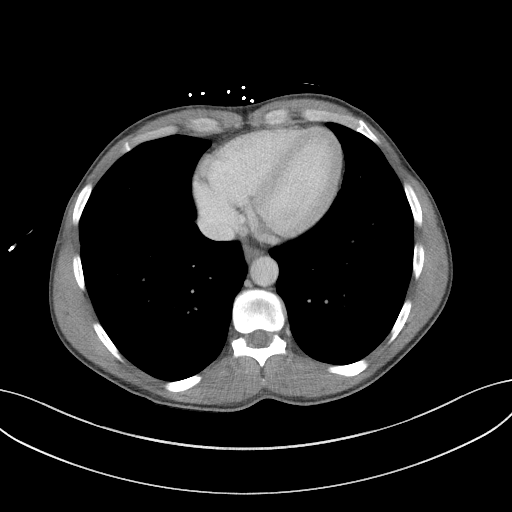

[Series 6: a/p w/ cor · coronal · 0.88mm/px · 3 of 144 slices shown]
[im 48/144  soft-tissue]
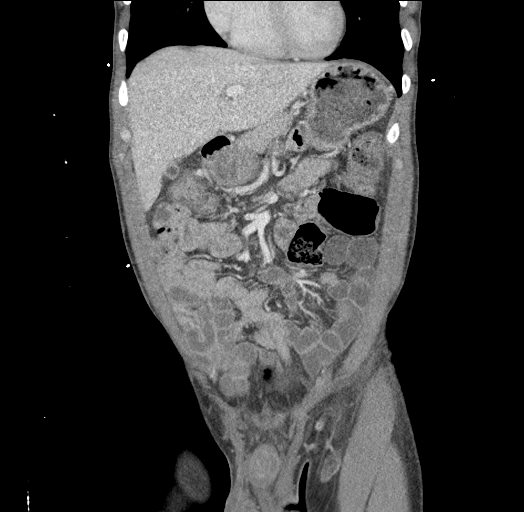
[im 64/144  soft-tissue]
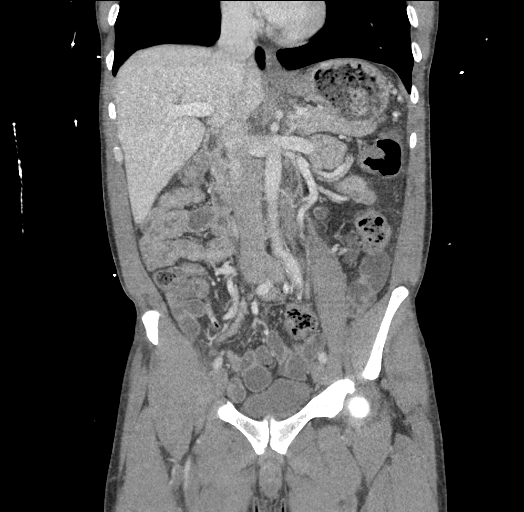
[im 80/144  soft-tissue]
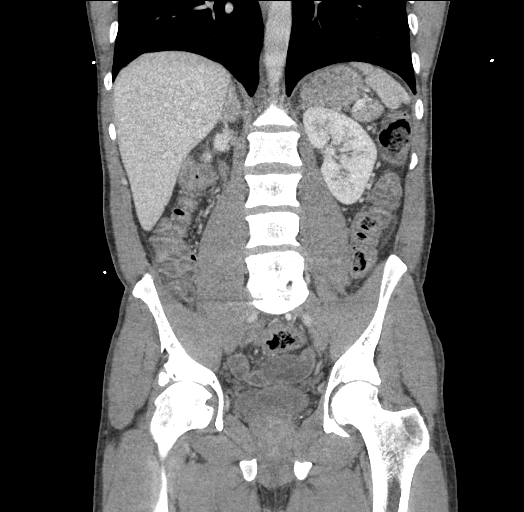

[16 of 46 positions shown; findings below may reference images not displayed]

RADIATION DOSE REDUCTION: This exam was performed according to the
departmental dose-optimization program which includes automated
exposure control, adjustment of the mA and/or kV according to
patient size and/or use of iterative reconstruction technique.

CONTRAST:  100mL OMNIPAQUE IOHEXOL 300 MG/ML  SOLN
FINDINGS: Lower chest: No acute abnormality.

Hepatobiliary: Similar-appearing 2.2 cm fluid dense lesion along the
right hepatic dome likely representing a cystic lesion. Otherwise no
focal liver abnormality. No gallstones, gallbladder wall thickening,
or pericholecystic fluid. No biliary dilatation.

Pancreas: No focal lesion. Normal pancreatic contour. No surrounding
inflammatory changes. No main pancreatic ductal dilatation.

Spleen: Normal in size without focal abnormality.

Adrenals/Urinary Tract:

No adrenal nodule bilaterally.

Bilateral kidneys enhance symmetrically.

No hydronephrosis. No hydroureter.

The urinary bladder is unremarkable.

Stomach/Bowel: Stomach is within normal limits. No evidence of small
bowel wall thickening or dilatation. No large bowel dilatation.
Ascending colon and hepatic flexure, proximal descending, rectal
colon bowel wall thickening likely due to under distension. No
associated pericolonic fat stranding. Appendix appears normal.

Vascular/Lymphatic: No abdominal aorta or iliac aneurysm. Mild
atherosclerotic plaque of the aorta and its branches. No abdominal,
pelvic, or inguinal lymphadenopathy.

Reproductive: Prostate is unremarkable.

Other: No intraperitoneal free fluid. No intraperitoneal free gas.
No organized fluid collection.

Musculoskeletal:

No abdominal wall hernia or abnormality.

No suspicious lytic or blastic osseous lesions. No acute displaced
fracture. Multilevel degenerative changes of the spine. Bilateral
hip degenerative changes.
IMPRESSION: 1. Ascending colon and hepatic flexure, proximal descending, rectal
colon bowel wall thickening likely due to under distension.
Developing colitis not fully excluded. Correlate clinically.
2. Otherwise no acute intra-abdominal or intrapelvic abnormality.

## 2023-12-03 ENCOUNTER — Emergency Department (HOSPITAL_COMMUNITY)
Admission: EM | Admit: 2023-12-03 | Discharge: 2023-12-04 | Disposition: A | Discharge status: Home / Self Care | Payer: Medicaid Other | Attending: Emergency Medicine | Admitting: Emergency Medicine

## 2023-12-03 ENCOUNTER — Other Ambulatory Visit: Payer: Self-pay

## 2023-12-03 ENCOUNTER — Encounter (HOSPITAL_COMMUNITY): Payer: Self-pay

## 2023-12-03 ENCOUNTER — Emergency Department (HOSPITAL_COMMUNITY): Payer: Medicaid Other

## 2023-12-03 DIAGNOSIS — R0789 Other chest pain: Secondary | ICD-10-CM | POA: Insufficient documentation

## 2023-12-03 DIAGNOSIS — F419 Anxiety disorder, unspecified: Secondary | ICD-10-CM | POA: Insufficient documentation

## 2023-12-03 DIAGNOSIS — R519 Headache, unspecified: Secondary | ICD-10-CM | POA: Diagnosis present

## 2023-12-03 DIAGNOSIS — R079 Chest pain, unspecified: Secondary | ICD-10-CM

## 2023-12-03 HISTORY — DX: Anxiety disorder, unspecified: F41.9

## 2023-12-03 LAB — CBC
HCT: 40.3 % (ref 39.0–52.0)
Hemoglobin: 13.3 g/dL (ref 13.0–17.0)
MCH: 28.7 pg (ref 26.0–34.0)
MCHC: 33 g/dL (ref 30.0–36.0)
MCV: 87 fL (ref 80.0–100.0)
Platelets: 378 10*3/uL (ref 150–400)
RBC: 4.63 MIL/uL (ref 4.22–5.81)
RDW: 14.7 % (ref 11.5–15.5)
WBC: 6.6 10*3/uL (ref 4.0–10.5)
nRBC: 0 % (ref 0.0–0.2)

## 2023-12-03 LAB — TROPONIN I (HIGH SENSITIVITY): Troponin I (High Sensitivity): 3 ng/L (ref <18)

## 2023-12-03 LAB — BASIC METABOLIC PANEL
Anion gap: 8 (ref 5–15)
BUN: 10 mg/dL (ref 6–20)
CO2: 26 mmol/L (ref 22–32)
Calcium: 9 mg/dL (ref 8.9–10.3)
Chloride: 108 mmol/L (ref 98–111)
Creatinine, Ser: 1.13 mg/dL (ref 0.61–1.24)
GFR, Estimated: >60 mL/min (ref >60)
Glucose, Bld: 71 mg/dL (ref 70–99)
Potassium: 3.8 mmol/L (ref 3.5–5.1)
Sodium: 142 mmol/L (ref 135–145)

## 2023-12-03 NOTE — ED Triage Notes (Signed)
 Pt reports intermittent CP and SHOB x2 weeks.

## 2023-12-04 ENCOUNTER — Emergency Department (HOSPITAL_COMMUNITY): Payer: Medicaid Other

## 2023-12-04 LAB — TROPONIN I (HIGH SENSITIVITY): Troponin I (High Sensitivity): 3 ng/L (ref <18)

## 2023-12-04 MED ORDER — SODIUM CHLORIDE 0.9 % IV BOLUS
1000.0000 mL | Freq: Once | INTRAVENOUS | Status: AC
  Administered 2023-12-04: 1000 mL via INTRAVENOUS

## 2023-12-04 MED ORDER — DIPHENHYDRAMINE HCL 50 MG/ML IJ SOLN
25.0000 mg | Freq: Once | INTRAMUSCULAR | Status: AC
  Administered 2023-12-04: 25 mg via INTRAVENOUS
  Filled 2023-12-04: qty 1

## 2023-12-04 MED ORDER — PROCHLORPERAZINE EDISYLATE 10 MG/2ML IJ SOLN
5.0000 mg | Freq: Once | INTRAMUSCULAR | Status: AC
Start: 2023-12-04 — End: 2023-12-04
  Administered 2023-12-04: 5 mg via INTRAVENOUS
  Filled 2023-12-04: qty 2

## 2023-12-04 MED ORDER — PROCHLORPERAZINE MALEATE 5 MG PO TABS: 5.0000 mg | ORAL_TABLET | Freq: Two times a day (BID) | ORAL | 0 refills | Status: DC | PRN

## 2023-12-04 NOTE — Discharge Instructions (Addendum)
Return to the Emergency Department if you have unusual chest pain, pressure, or discomfort, shortness of breath, nausea, vomiting, burping, heartburn, tingling upper body parts, sweating, cold, clammy skin, or racing heartbeat. Call 911 if you think you are having a heart attack. Follow cardiac diet - avoid fatty & fried foods, don't eat too much red meat, eat lots of fruits & vegetables, and dairy products should be low fat. Please lose weight if you are overweight. Become more active with walking, gardening, or any other activity that gets you to moving.   Please return to the emergency department immediately for any new or concerning symptoms, or if you get worse. 

## 2023-12-04 NOTE — ED Notes (Signed)
 Patient has returned from CT

## 2023-12-04 NOTE — ED Provider Notes (Signed)
 [place] [facility] AT [facility] Provider Note  CSN: [id number] Arrival date & time: 12/03/23 2220  Chief Complaint(s) Chest Pain  HPI James Sexton is a 48 y.o. male with past medical history as below, significant for anxiety who presents to the ED with complaint of chest pain and headache  Patient reports intermittent headache and chest tightness over the past 2 weeks.  Has occasional headaches over the past few years with similar symptoms, throbbing pulsing sensation, photophobia, nausea.  Began having chest tightness over the past 2 weeks that he attributes to his anxiety, occurs intermittently.  He took Tylenol  and a muscle relaxer yesterday which did improve his symptoms but they came back after a few hours.  No recent falls or head injuries, no numbness or weakness to extremities, no gait disturbance.  No nausea  Past Medical History Past Medical History:  Diagnosis Date   Anxiety    There are no active problems to display for this patient.  Home Medication(s) Prior to Admission medications   Medication Sig Start Date End Date Taking? Authorizing Provider  prochlorperazine  (COMPAZINE ) 5 MG tablet Take 1 tablet (5 mg total) by mouth 2 (two) times daily as needed for up to 10 doses for nausea or vomiting. 12/04/23  Yes Elnor Savant A, DO  acetaminophen  (TYLENOL ) 500 MG tablet Take 1,000 mg by mouth every 6 (six) hours as needed for moderate pain or headache.    [provider]  ciprofloxacin  (CIPRO ) 500 MG tablet Take 1 tablet (500 mg total) by mouth 2 (two) times daily. 03/08/23   Palumbo, April, MD  dicyclomine  (BENTYL ) 20 MG tablet Take 1 tablet (20 mg total) by mouth 2 (two) times daily. 03/08/23   Palumbo, April, MD  methocarbamol  (ROBAXIN ) 500 MG tablet Take 1 tablet (500 mg total) by mouth every 8 (eight) hours as needed for muscle spasms. 05/29/22   Raspet, Erin K, PA-C  metroNIDAZOLE  (FLAGYL ) 500 MG tablet Take 1 tablet (500 mg total) by mouth 3  (three) times daily. 03/08/23   Palumbo, April, MD  ondansetron  (ZOFRAN -ODT) 4 MG disintegrating tablet Take 1 tablet (4 mg total) by mouth every 8 (eight) hours as needed for nausea or vomiting. 12/17/21   Petrucelli, Samantha R, PA-C  pantoprazole  (PROTONIX ) 20 MG tablet Take 1 tablet (20 mg total) by mouth daily. 12/17/21   Petrucelli, Samantha R, PA-C  predniSONE  (STERAPRED UNI-PAK 21 TAB) 10 MG (21) TBPK tablet As directed 05/29/22   Raspet, Rocky POUR, PA-C                                                                                                                                    Past Surgical History Past Surgical History:  Procedure Laterality Date   BACK SURGERY  01/2014   Pins and a rod   Family History Family History  Problem Relation Age of Onset   Hypertension Mother  Diabetes Mother    Hypertension Father    Diabetes Father     Social History Social History   Tobacco Use   Smoking status: Never   Smokeless tobacco: Never  Vaping Use   Vaping status: Never Used  Substance Use Topics   Alcohol use: Yes   Drug use: Yes    Frequency: 7.0 times per week    Types: Marijuana    Comment: daily   Allergies Penicillins  Review of Systems Review of Systems  Constitutional:  Negative for chills and fever.  Respiratory:  Positive for chest tightness. Negative for shortness of breath.   Cardiovascular:  Negative for chest pain and palpitations.  Gastrointestinal:  Negative for abdominal pain and nausea.  Genitourinary:  Negative for dysuria.  Musculoskeletal:  Positive for arthralgias.  Neurological:  Positive for headaches. Negative for weakness and numbness.  Psychiatric/Behavioral:  The patient is nervous/anxious.   All other systems reviewed and are negative.   Physical Exam Vital Signs  I have reviewed the triage vital signs BP (!) 136/93 (BP Location: Right Arm) Comment: Simultaneous filing. User may not have seen previous data.  Pulse 68 Comment:  Simultaneous filing. User may not have seen previous data.  Temp 98.1 F (36.7 C) (Oral)   Resp 14   Ht 5' 8 (1.727 m)   Wt 72.6 kg   SpO2 99%   BMI 24.33 kg/m  Physical Exam Vitals and nursing note reviewed.  Constitutional:      General: He is not in acute distress.    Appearance: Normal appearance. He is well-developed. He is not ill-appearing.  HENT:     Head: Normocephalic and atraumatic.     Right Ear: External ear normal.     Left Ear: External ear normal.     Mouth/Throat:     Mouth: Mucous membranes are moist.  Eyes:     General: No scleral icterus.    Extraocular Movements: Extraocular movements intact.     Pupils: Pupils are equal, round, and reactive to light.  Cardiovascular:     Rate and Rhythm: Normal rate and regular rhythm.     Pulses: Normal pulses.     Heart sounds: Normal heart sounds.  Pulmonary:     Effort: Pulmonary effort is normal. No respiratory distress.     Breath sounds: Normal breath sounds. No wheezing.  Abdominal:     General: Abdomen is flat.     Palpations: Abdomen is soft.     Tenderness: There is no abdominal tenderness. There is no guarding or rebound.  Musculoskeletal:     Cervical back: No rigidity.     Right lower leg: No edema.     Left lower leg: No edema.  Skin:    General: Skin is warm and dry.     Capillary Refill: Capillary refill takes less than 2 seconds.  Neurological:     Mental Status: He is alert and oriented to person, place, and time.     GCS: GCS eye subscore is 4. GCS verbal subscore is 5. GCS motor subscore is 6.     Cranial Nerves: Cranial nerves 2-12 are intact. No facial asymmetry.     Sensory: Sensation is intact. No sensory deficit.     Motor: Motor function is intact. No tremor.     Coordination: Coordination is intact.     Gait: Gait is intact.     Comments: Strength 5/5 to BLUE/BLLE, equal and symmetric    Psychiatric:  Mood and Affect: Mood is anxious.        Behavior: Behavior normal.      ED Results and Treatments Labs (all labs ordered are listed, but only abnormal results are displayed) Labs Reviewed  BASIC METABOLIC PANEL  CBC  TROPONIN I (HIGH SENSITIVITY)  TROPONIN I (HIGH SENSITIVITY)                                                                                                                          Radiology CT Head Wo Contrast Result Date: 12/04/2023 CLINICAL DATA:  Headache, increasing frequency or severity EXAM: CT HEAD WITHOUT CONTRAST TECHNIQUE: Contiguous axial images were obtained from the base of the skull through the vertex without intravenous contrast. RADIATION DOSE REDUCTION: This exam was performed according to the departmental dose-optimization program which includes automated exposure control, adjustment of the mA and/or kV according to patient size and/or use of iterative reconstruction technique. COMPARISON:  None Available. FINDINGS: Brain: No evidence of acute infarction, hemorrhage, hydrocephalus, extra-axial collection or mass lesion/mass effect. Vascular: No hyperdense vessel. Skull: No acute fracture. Sinuses/Orbits: Clear sinuses.  No acute orbital findings. Other: No mastoid effusions. IMPRESSION: No evidence of acute intracranial abnormality. Electronically Signed   By: Gilmore GORMAN Molt M.D.   On: 12/04/2023 02:16   DG Chest 2 View Result Date: 12/03/2023 CLINICAL DATA:  Chest pain EXAM: CHEST - 2 VIEW COMPARISON:  07/30/2019 FINDINGS: The heart size and mediastinal contours are within normal limits. Both lungs are clear. The visualized skeletal structures are unremarkable. IMPRESSION: No active cardiopulmonary disease. Electronically Signed   By: Oneil Devonshire M.D.   On: 12/03/2023 23:23    Pertinent labs & imaging results that were available during my care of the patient were reviewed by me and considered in my medical decision making (see MDM for details).  Medications Ordered in ED Medications  prochlorperazine  (COMPAZINE ) injection  5 mg (5 mg Intravenous Given 12/04/23 0145)  diphenhydrAMINE  (BENADRYL ) injection 25 mg (25 mg Intravenous Given 12/04/23 0147)  sodium chloride  0.9 % bolus 1,000 mL (1,000 mLs Intravenous New Bag/Given 12/04/23 0143)                                                                                                                                     Procedures Procedures  (including critical care time)  Medical Decision Making / ED Course    Medical Decision  Making:    James Sexton is a 48 y.o. male with past medical history as below, significant for anxiety who presents to the ED with complaint of chest pain and headache. The complaint involves an extensive differential diagnosis and also carries with it a high risk of complications and morbidity.  Serious etiology was considered. Ddx includes but is not limited to: Differential includes all life-threatening causes for chest pain. This includes but is not exclusive to acute coronary syndrome, aortic dissection, pulmonary embolism, cardiac tamponade, community-acquired pneumonia, pericarditis, musculoskeletal chest wall pain, etc. Differential diagnosis includes but is not exclusive to subarachnoid hemorrhage, meningitis, encephalitis, previous head trauma, cavernous venous thrombosis, muscle tension headache, glaucoma, temporal arteritis, migraine or migraine equivalent, etc.   Complete initial physical exam performed, notably the patient was in no distress, neuro non-focal. .    Reviewed and confirmed nursing documentation for past medical history, family history, social history.  Vital signs reviewed.      Clinical Course as of 12/04/23 0315  Sun Dec 04, 2023  0232 [facility] wnl CXR wnl [SG]  0310 Symptoms resolved [SG]    Clinical Course User Index [SG] Elnor Jayson LABOR, DO    Brief summary:   Patient presents with headache. Based on the patient's history and physical there is very low clinical suspicion for significant intracranial  pathology. The headache was not sudden onset, not maximal at onset, there are no neurologic findings on exam, the patient does not have a fever, the patient does not have any jaw claudication, the patient does not endorse a clotting disorder, patient denies any trauma or eye pain and the headache is not associated with dizziness, weakness on one side of the body, diplopia, vertigo, slurred speech, or ataxia. Given the extremely low risk of these diagnoses further testing and evaluation for these possibilities does not appear to be indicated at this time.  The patient's chest pain is not suggestive of pulmonary embolus, cardiac ischemia, aortic dissection, pericarditis, myocarditis, pulmonary embolism, pneumothorax, pneumonia, Zoster, or esophageal perforation, or other serious etiology.  Historically not abrupt in onset, tearing or ripping, pulses symmetric. EKG nonspecific for ischemia/infarction. No dysrhythmias, brugada, WPW, prolonged QT noted.   Troponin negative x2. CXR reviewed. Labs without demonstration of acute pathology unless otherwise noted above. Low HEART Score: 0-3 points (0.9-1.7% risk of MACE)  Given the extremely low risk of these diagnoses further testing and evaluation for these possibilities does not appear to be indicated at this time.   Possible viral etiology of symptoms today, not septic, no hypoxia, symptoms resolved   Patient in no distress and overall condition improved here in the ED. Detailed discussions were had with the patient regarding current findings, and need for close f/u with PCP or on call doctor. The patient has been instructed to return immediately if the symptoms worsen in any way for re-evaluation. Patient verbalized understanding and is in agreement with current care plan. All questions answered prior to discharge.             Additional history obtained: -Additional history obtained from na -External records from outside source obtained and  reviewed including: Chart review including previous notes, labs, imaging, consultation notes including  Prior ED visits, home medications, prior labs and imaging   Lab Tests: -I ordered, reviewed, and interpreted labs.   The pertinent results include:   Labs Reviewed  BASIC METABOLIC PANEL  CBC  TROPONIN I (HIGH SENSITIVITY)  TROPONIN I (HIGH SENSITIVITY)    Notable for labs  stable  EKG   EKG Interpretation Date/Time:  Saturday December 03 2023 22:53:11 EST Ventricular Rate:  65 PR Interval:  150 QRS Duration:  86 QT Interval:  380 QTC Calculation: 395 R Axis:   34  Text Interpretation: Normal sinus rhythm Septal infarct , age undetermined Abnormal ECG When compared with ECG of 08-Mar-2023 04:13, PREVIOUS ECG IS PRESENT no stemi similar to prior Confirmed by Elnor Savant ([id number]) on 12/04/2023 12:01:29 AM         Imaging Studies ordered: I ordered imaging studies including chest x-ray, CT head I independently visualized the following imaging with scope of interpretation limited to determining acute life threatening conditions related to emergency care; findings noted above I independently visualized and interpreted imaging. I agree with the radiologist interpretation   Medicines ordered and prescription drug management: Meds ordered this encounter  Medications   prochlorperazine  (COMPAZINE ) injection 5 mg   diphenhydrAMINE  (BENADRYL ) injection 25 mg   sodium chloride  0.9 % bolus 1,000 mL   prochlorperazine  (COMPAZINE ) 5 MG tablet    Sig: Take 1 tablet (5 mg total) by mouth 2 (two) times daily as needed for up to 10 doses for nausea or vomiting.    Dispense:  10 tablet    Refill:  0    -I have reviewed the patients home medicines and have made adjustments as needed   Consultations Obtained: Not applicable  Cardiac Monitoring: The patient was maintained on a cardiac monitor.  I personally viewed and interpreted the cardiac monitored which showed an underlying rhythm  of: NSR Continuous pulse oximetry interpreted by myself, 99% on RA.    Social Determinants of Health:  Diagnosis or treatment significantly limited by social determinants of health: no pcp   Reevaluation: After the interventions noted above, I reevaluated the patient and found that they have resolved  Co morbidities that complicate the patient evaluation  Past Medical History:  Diagnosis Date   Anxiety       Dispostion: Disposition decision including need for hospitalization was considered, and patient discharged from emergency department.    Final Clinical Impression(s) / ED Diagnoses Final diagnoses:  Nonintractable headache, unspecified chronicity pattern, unspecified headache type  Chest pain with low risk of acute coronary syndrome        Elnor Savant LABOR, DO 12/04/23 [id number]

## 2024-03-30 ENCOUNTER — Encounter (HOSPITAL_COMMUNITY): Payer: Self-pay

## 2024-03-30 ENCOUNTER — Ambulatory Visit (HOSPITAL_COMMUNITY)
Admission: EM | Admit: 2024-03-30 | Discharge: 2024-03-30 | Disposition: A | Discharge status: Home / Self Care | Attending: Physician Assistant | Admitting: Physician Assistant

## 2024-03-30 ENCOUNTER — Ambulatory Visit (INDEPENDENT_AMBULATORY_CARE_PROVIDER_SITE_OTHER)

## 2024-03-30 DIAGNOSIS — M5441 Lumbago with sciatica, right side: Secondary | ICD-10-CM

## 2024-03-30 DIAGNOSIS — M5442 Lumbago with sciatica, left side: Secondary | ICD-10-CM

## 2024-03-30 DIAGNOSIS — Z9889 Other specified postprocedural states: Secondary | ICD-10-CM

## 2024-03-30 DIAGNOSIS — M546 Pain in thoracic spine: Secondary | ICD-10-CM

## 2024-03-30 DIAGNOSIS — G8929 Other chronic pain: Secondary | ICD-10-CM

## 2024-03-30 LAB — POCT URINALYSIS DIP (MANUAL ENTRY)
Bilirubin, UA: NEGATIVE
Blood, UA: NEGATIVE
Glucose, UA: NEGATIVE mg/dL
Leukocytes, UA: NEGATIVE
Nitrite, UA: NEGATIVE
Spec Grav, UA: 1.025 (ref 1.010–1.025)
Urobilinogen, UA: 0.2 U/dL
pH, UA: 5.5 (ref 5.0–8.0)

## 2024-03-30 MED ORDER — METHOCARBAMOL 500 MG PO TABS: 500.0000 mg | ORAL_TABLET | Freq: Three times a day (TID) | ORAL | 0 refills | Status: DC | PRN

## 2024-03-30 MED ORDER — KETOROLAC TROMETHAMINE 30 MG/ML IJ SOLN
30.0000 mg | Freq: Once | INTRAMUSCULAR | Status: AC
  Administered 2024-03-30: 30 mg via INTRAMUSCULAR

## 2024-03-30 MED ORDER — KETOROLAC TROMETHAMINE 30 MG/ML IJ SOLN
INTRAMUSCULAR | Status: AC
  Filled 2024-03-30: qty 1

## 2024-03-30 MED ORDER — LIDOCAINE 5 % EX PTCH: 1.0000 | MEDICATED_PATCH | CUTANEOUS | 0 refills | Status: DC

## 2024-03-30 NOTE — Discharge Instructions (Addendum)
 Your urine did not show any evidence of infection.  Your x-rays showed surgical changes and some arthritis but nothing concerning.  We gave an injection of Toradol  today so please do not take any NSAIDs including aspirin, ibuprofen /Advil , naproxen /Aleve  for the next 24 hours.  Use lidocaine  patch during the day and then remove this at night using only 1 patch per 24 hours.  Take methocarbamol  up to 3 times a day.  Use heat and gentle stretch for symptom relief.  Follow-up with sports medicine for further evaluation and management.  If anything worsens and you have increasing pain, difficulty moving your legs, numbness or tingling on the inside of your thighs, going to the bathroom on yourself without noticing it you need to be seen immediately.
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
## 2024-03-30 NOTE — ED Triage Notes (Signed)
 Patient here today with c/o LB pain that radiates down both legs X 3 days. The pain worsened this morning. Patient has a h/o sciatica.

## 2024-08-16 ENCOUNTER — Encounter (HOSPITAL_COMMUNITY): Payer: Self-pay

## 2024-08-16 ENCOUNTER — Ambulatory Visit (HOSPITAL_COMMUNITY)
Admission: EM | Admit: 2024-08-16 | Discharge: 2024-08-16 | Disposition: A | Discharge status: Home / Self Care | Attending: Internal Medicine | Admitting: Internal Medicine

## 2024-08-16 DIAGNOSIS — H1013 Acute atopic conjunctivitis, bilateral: Secondary | ICD-10-CM

## 2024-08-16 DIAGNOSIS — J309 Allergic rhinitis, unspecified: Secondary | ICD-10-CM | POA: Diagnosis not present

## 2024-08-16 MED ORDER — FLUTICASONE PROPIONATE 50 MCG/ACT NA SUSP: 2.0000 | Freq: Every day | NASAL | 0 refills | Status: DC

## 2024-08-16 MED ORDER — OLOPATADINE HCL 0.2 % OP SOLN: 1.0000 [drp] | Freq: Every day | OPHTHALMIC | 0 refills | Status: DC

## 2024-08-16 MED ORDER — PREDNISONE 20 MG PO TABS: 40.0000 mg | ORAL_TABLET | Freq: Every day | ORAL | 0 refills | Status: AC

## 2024-08-16 NOTE — Discharge Instructions (Signed)
 Your symptoms are likely due to environmental allergies.    Take prednisone  40mg  (2 pills) once daily for the next 5 days to treat acute allergy symptoms.   Avoid exposure to allergens.  Take oral antihistamine (Either Zyrtec, Claritin, or Allegra) and use Flonase  daily as directed.  You can buy these medications over the counter.  These medications can take a few days to fully kick in to your body and start working.  If your eyes become itchy, you may purchase olopatadine  (Pataday ) eyedrops over the counter and use as directed to relieve watery/itchy eyes associated with allergies as well.   Schedule an appointment with your primary care provider for follow-up and further management of your seasonal allergies as well as ongoing preventive healthcare.

## 2024-08-16 NOTE — ED Provider Notes (Signed)
 [facility]    CSN: [id number] Arrival date & time: 08/16/24  1226      History   Chief Complaint Chief Complaint  Patient presents with   Facial Swelling    HPI James Sexton is a 48 y.o. male.   James Sexton is a 48 y.o. male presenting for chief complaint of swelling to the soft tissues of both eyes, watery/itchy eyes, bilateral eye redness, and runny nose and headache that started yesterday. He has had mild symptoms for the last 3-4 days, symptoms worsened significantly yesterday. Both eyes are equally affected. Mucous is clear. Cough is minimal and more of a throat clearing sensation due to post-nasal drip. Denies blurry vision, photophobia,  dizziness, ear pain, fever/chills, sore throat, and dizziness. He does not use contacts or glasses for vision correction. Denies recent injuries to the eyes. History of seasonal allergies, currently takes zyrtec for allergies daily. Denies history of asthma and he is not short of breath or congested to the chest. He has not attempted treatment of eye itching at home.      Past Medical History:  Diagnosis Date   Anxiety     There are no active problems to display for this patient.   Past Surgical History:  Procedure Laterality Date   BACK SURGERY  01/2014   Pins and a rod       Home Medications    Prior to Admission medications   Medication Sig Start Date End Date Taking? Authorizing Provider  fluticasone  (FLONASE ) 50 MCG/ACT nasal spray Place 2 sprays into both nostrils daily. 08/16/24  Yes Enedelia Dorna HERO, FNP  Olopatadine  HCl 0.2 % SOLN Apply 1 drop to eye daily. 08/16/24  Yes Enedelia Dorna HERO, FNP  predniSONE  (DELTASONE ) 20 MG tablet Take 2 tablets (40 mg total) by mouth daily with breakfast for 5 days. 08/16/24 08/21/24 Yes StanhopeDorna HERO, FNP  acetaminophen  (TYLENOL ) 500 MG tablet Take 1,000 mg by mouth every 6 (six) hours as needed for moderate pain or headache. Patient not taking:  Reported on 03/30/2024    [provider]  lidocaine  (LIDODERM ) 5 % Place 1 patch onto the skin daily. Remove & Discard patch within 12 hours or as directed by MD 03/30/24   Raspet, Rocky K, PA-C  methocarbamol  (ROBAXIN ) 500 MG tablet Take 1 tablet (500 mg total) by mouth every 8 (eight) hours as needed for muscle spasms. 03/30/24   Raspet, Erin K, PA-C  pantoprazole  (PROTONIX ) 20 MG tablet Take 1 tablet (20 mg total) by mouth daily. Patient not taking: Reported on 03/30/2024 12/17/21   Petrucelli, Lucie SAUNDERS, PA-C    Family History Family History  Problem Relation Age of Onset   Hypertension Mother    Diabetes Mother    Hypertension Father    Diabetes Father     Social History Social History   Tobacco Use   Smoking status: Never   Smokeless tobacco: Never  Vaping Use   Vaping status: Never Used  Substance Use Topics   Alcohol use: Yes   Drug use: Yes    Frequency: 7.0 times per week    Types: Marijuana    Comment: daily     Allergies   Penicillins   Review of Systems Review of Systems Per HPI  Physical Exam Triage Vital Signs ED Triage Vitals  Encounter Vitals Group     BP 08/16/24 1323 116/64     Girls Systolic BP Percentile --      Girls Diastolic  BP Percentile --      Boys Systolic BP Percentile --      Boys Diastolic BP Percentile --      Pulse Rate 08/16/24 1323 (!) 53     Resp 08/16/24 1323 16     Temp 08/16/24 1323 98.3 F (36.8 C)     Temp Source 08/16/24 1323 Oral     SpO2 08/16/24 1323 95 %     Weight --      Height --      Head Circumference --      Peak Flow --      Pain Score 08/16/24 1325 3     Pain Loc --      Pain Education --      Exclude from Growth Chart --    No data found.  Updated Vital Signs BP 116/64 (BP Location: Left Arm)   Pulse (!) 53   Temp 98.3 F (36.8 C) (Oral)   Resp 16   SpO2 95%   Visual Acuity Right Eye Distance:   Left Eye Distance:   Bilateral Distance:    Right Eye Near:   Left Eye Near:     Bilateral Near:     Physical Exam Vitals and nursing note reviewed.  Constitutional:      Appearance: He is not ill-appearing or toxic-appearing.  HENT:     Head: Normocephalic and atraumatic.     Right Ear: Hearing, tympanic membrane, ear canal and external ear normal.     Left Ear: Hearing, tympanic membrane, ear canal and external ear normal.     Nose: Nose normal.     Mouth/Throat:     Lips: Pink.     Mouth: Mucous membranes are moist. No injury or oral lesions.     Dentition: Normal dentition.     Tongue: No lesions.     Pharynx: Oropharynx is clear. Uvula midline. No pharyngeal swelling, oropharyngeal exudate, posterior oropharyngeal erythema, uvula swelling or postnasal drip.     Tonsils: No tonsillar exudate.  Eyes:     General: Lids are normal. Vision grossly intact. Gaze aligned appropriately. Allergic shiner present. No visual field deficit or scleral icterus.       Right eye: Discharge (copious watery discharge) present.        Left eye: Discharge (copious watery discharge) present.    Extraocular Movements: Extraocular movements intact.     Conjunctiva/sclera:     Right eye: Right conjunctiva is injected.     Left eye: Left conjunctiva is injected.     Comments: EOMs intact without pain or dizziness.  Neck:     Trachea: Trachea and phonation normal.  Cardiovascular:     Rate and Rhythm: Normal rate and regular rhythm.     Heart sounds: Normal heart sounds, S1 normal and S2 normal.  Pulmonary:     Effort: Pulmonary effort is normal. No respiratory distress.     Breath sounds: Normal breath sounds and air entry.  Musculoskeletal:     Cervical back: Neck supple.  Lymphadenopathy:     Cervical: No cervical adenopathy.  Skin:    General: Skin is warm and dry.     Capillary Refill: Capillary refill takes less than 2 seconds.     Findings: No rash.  Neurological:     General: No focal deficit present.     Mental Status: He is alert and oriented to person, place,  and time. Mental status is at baseline.     Cranial Nerves: No  dysarthria or facial asymmetry.  Psychiatric:        Mood and Affect: Mood normal.        Speech: Speech normal.        Behavior: Behavior normal.        Thought Content: Thought content normal.        Judgment: Judgment normal.      UC Treatments / Results  Labs (all labs ordered are listed, but only abnormal results are displayed) Labs Reviewed - No data to display  EKG   Radiology No results found.  Procedures Procedures (including critical care time)  Medications Ordered in UC Medications - No data to display  Initial Impression / Assessment and Plan / UC Course  I have reviewed the triage vital signs and the nursing notes.  Pertinent labs & imaging results that were available during my care of the patient were reviewed by me and considered in my medical decision making (see chart for details).   1. Allergic conjunctivitis of both eyes and rhinitis Symptoms due to allergic rhinitis and conjunctivitis.  Low suspicion for viral/bacterial conjunctivitis/etiology of symptoms.  Treatment recommendations: olopatadine  eye drops PRN, Zyrtec/Flonase  daily.  Supportive care/warm compresses discussed.  Avoid allergen triggers.   We will treat with a short course of prednisone  to reduce swelling and irritation to the eyes.   Counseled patient on potential for adverse effects with medications prescribed/recommended today, strict ER and return-to-clinic precautions discussed, patient verbalized understanding.    Final Clinical Impressions(s) / UC Diagnoses   Final diagnoses:  Allergic conjunctivitis of both eyes and rhinitis     Discharge Instructions      Your symptoms are likely due to environmental allergies.    Take prednisone  40mg  (2 pills) once daily for the next 5 days to treat acute allergy symptoms.   Avoid exposure to allergens.  Take oral antihistamine (Either Zyrtec, Claritin, or Allegra) and  use Flonase  daily as directed.  You can buy these medications over the counter.  These medications can take a few days to fully kick in to your body and start working.  If your eyes become itchy, you may purchase olopatadine  (Pataday ) eyedrops over the counter and use as directed to relieve watery/itchy eyes associated with allergies as well.   Schedule an appointment with your primary care provider for follow-up and further management of your seasonal allergies as well as ongoing preventive healthcare.    ED Prescriptions     Medication Sig Dispense Auth. Provider   Olopatadine  HCl 0.2 % SOLN Apply 1 drop to eye daily. 3.5 mL Enedelia Going M, FNP   fluticasone  (FLONASE ) 50 MCG/ACT nasal spray Place 2 sprays into both nostrils daily. 16 g Nadja Lina M, FNP   predniSONE  (DELTASONE ) 20 MG tablet Take 2 tablets (40 mg total) by mouth daily with breakfast for 5 days. 10 tablet Enedelia Going HERO, FNP      PDMP not reviewed this encounter.   Enedelia Going HERO, [place] 08/16/24 [phone]

## 2024-08-16 NOTE — ED Triage Notes (Addendum)
 Pt states bilateral eye redness, swelling, itching  and watery since last night.

## 2024-12-14 ENCOUNTER — Ambulatory Visit (HOSPITAL_COMMUNITY)
Admission: EM | Admit: 2024-12-14 | Discharge: 2024-12-14 | Disposition: A | Discharge status: Home / Self Care | Attending: Emergency Medicine | Admitting: Emergency Medicine

## 2024-12-14 ENCOUNTER — Encounter (HOSPITAL_COMMUNITY): Payer: Self-pay

## 2024-12-14 DIAGNOSIS — M5441 Lumbago with sciatica, right side: Secondary | ICD-10-CM

## 2024-12-14 DIAGNOSIS — M5442 Lumbago with sciatica, left side: Secondary | ICD-10-CM | POA: Diagnosis not present

## 2024-12-14 MED ORDER — METHOCARBAMOL 500 MG PO TABS: 500.0000 mg | ORAL_TABLET | Freq: Two times a day (BID) | ORAL | 0 refills | Status: AC

## 2024-12-14 MED ORDER — PREDNISONE 20 MG PO TABS: 40.0000 mg | ORAL_TABLET | Freq: Every day | ORAL | 0 refills | Status: AC

## 2024-12-14 MED ORDER — LIDOCAINE 5 % EX PTCH: 1.0000 | MEDICATED_PATCH | CUTANEOUS | 0 refills | Status: AC

## 2024-12-14 NOTE — Discharge Instructions (Signed)
 Start taking 2 tablets of prednisone  once daily for 5 days to help with inflammation related to pain. You can take Robaxin  twice daily as needed.  This can make you drowsy so do not drive, work, or drink alcohol while taking this. A lidocaine  patch for 12 hours at a time once daily for additional pain relief. Otherwise take 500 to 1000 mg of Tylenol  every 6-8 hours as breakthrough pain. Follow-up with [facility] if your pain continues for further evaluation.

## 2024-12-14 NOTE — ED Triage Notes (Signed)
 Patient here today with c/o low back pain that has been going on for a while now. Patient has tried Excedrin and some muscle relaxers with some relief.

## 2024-12-14 NOTE — ED Provider Notes (Signed)
 " James Sexton    CSN: [id number] Arrival date & time: 12/14/24  1624      History   Chief Complaint Chief Complaint  Patient presents with   Back Pain    HPI James Sexton is a 49 y.o. male.   Patient presents with bilateral low back pain that began yesterday.  Patient reports the pain occasionally radiates down both of his legs.  Patient does have a history of pain similar to this with sciatica.  Patient reports that he tried taking Excedrin and one of his coworkers muscle relaxers with minimal relief.  Patient denied recent falls and injuries.  Patient denies saddle anesthesia or bowel/bladder incontinence.  The history is provided by the patient and medical records.  Back Pain   Past Medical History:  Diagnosis Date   Anxiety     There are no active problems to display for this patient.   Past Surgical History:  Procedure Laterality Date   BACK SURGERY  01/2014   Pins and a rod       Home Medications    Prior to Admission medications  Medication Sig Start Date End Date Taking? Authorizing Provider  lidocaine  (LIDODERM ) 5 % Place 1 patch onto the skin daily. Remove & Discard patch within 12 hours or as directed by MD 12/14/24  Yes Johnie Flaming A, NP  methocarbamol  (ROBAXIN ) 500 MG tablet Take 1 tablet (500 mg total) by mouth 2 (two) times daily. 12/14/24  Yes Johnie, Ivelis Norgard A, NP  predniSONE  (DELTASONE ) 20 MG tablet Take 2 tablets (40 mg total) by mouth daily for 5 days. 12/14/24 12/19/24 Yes Norberto Wishon A, NP  pantoprazole  (PROTONIX ) 20 MG tablet Take 1 tablet (20 mg total) by mouth daily. Patient not taking: Reported on 03/30/2024 12/17/21   Petrucelli, Lucie SAUNDERS, PA-C    Family History Family History  Problem Relation Age of Onset   Hypertension Mother    Diabetes Mother    Hypertension Father    Diabetes Father     Social History Social History[1]   Allergies   Penicillins   Review of Systems Review of Systems   Musculoskeletal:  Positive for back pain.   Per HPI  Physical Exam Triage Vital Signs ED Triage Vitals  Encounter Vitals Group     BP 12/14/24 1705 125/87     Girls Systolic BP Percentile --      Girls Diastolic BP Percentile --      Boys Systolic BP Percentile --      Boys Diastolic BP Percentile --      Pulse Rate 12/14/24 1705 69     Resp 12/14/24 1705 16     Temp 12/14/24 1705 98.3 F (36.8 C)     Temp Source 12/14/24 1705 Oral     SpO2 12/14/24 1705 98 %     Weight --      Height --      Head Circumference --      Peak Flow --      Pain Score 12/14/24 1702 10     Pain Loc --      Pain Education --      Exclude from Growth Chart --    No data found.  Updated Vital Signs BP 125/87 (BP Location: Left Arm)   Pulse 69   Temp 98.3 F (36.8 C) (Oral)   Resp 16   SpO2 98%   Visual Acuity Right Eye Distance:   Left Eye Distance:   Bilateral  Distance:    Right Eye Near:   Left Eye Near:    Bilateral Near:     Physical Exam Vitals and nursing note reviewed.  Constitutional:      General: He is awake. He is not in acute distress.    Appearance: Normal appearance. He is well-developed and well-groomed. He is not ill-appearing.  Musculoskeletal:     Cervical back: Normal.     Thoracic back: Normal.     Lumbar back: Spasms and tenderness present. No swelling, edema, deformity, signs of trauma or bony tenderness. Normal range of motion. Negative right straight leg raise test and negative left straight leg raise test.       Back:     Comments: Tenderness noted to bilateral low back without spinous process tenderness.  Skin:    General: Skin is warm and dry.  Neurological:     General: No focal deficit present.     Mental Status: He is alert and oriented to person, place, and time. Mental status is at baseline.  Psychiatric:        Behavior: Behavior is cooperative.      UC Treatments / Results  Labs (all labs ordered are listed, but only abnormal results  are displayed) Labs Reviewed - No data to display  EKG   Radiology No results found.  Procedures Procedures (including critical care time)  Medications Ordered in UC Medications - No data to display  Initial Impression / Assessment and Plan / UC Course  I have reviewed the triage vital signs and the nursing notes.  Pertinent labs & imaging results that were available during my care of the patient were reviewed by me and considered in my medical decision making (see chart for details).     Patient is overall well-appearing.  Vitals are stable.  Back pain likely muscular in nature with sciatica present.  Offered patient steroid injection in clinic, but patient declined this.  Prescribed short course of prednisone  to help with inflammation related to pain.  Prescribed muscle relaxer for muscle pain and spasms.  Prescribed lidocaine  patches for additional pain relief.  Given patient orthopedic follow-up if needed.  Discussed follow-up and return precautions. Final Clinical Impressions(s) / UC Diagnoses   Final diagnoses:  Acute bilateral low back pain with bilateral sciatica     Discharge Instructions      Start taking 2 tablets of prednisone  once daily for 5 days to help with inflammation related to pain. You can take Robaxin  twice daily as needed.  This can make you drowsy so do not drive, work, or drink alcohol while taking this. A lidocaine  patch for 12 hours at a time once daily for additional pain relief. Otherwise take 500 to 1000 mg of Tylenol  every 6-8 hours as breakthrough pain. Follow-up with [facility] [place] [facility] if your pain continues for further evaluation.   ED Prescriptions     Medication Sig Dispense Auth. Provider   methocarbamol  (ROBAXIN ) 500 MG tablet Take 1 tablet (500 mg total) by mouth 2 (two) times daily. 20 tablet Johnie Flaming A, NP   predniSONE  (DELTASONE ) 20 MG tablet Take 2 tablets (40 mg total) by mouth daily for 5 days. 10 tablet  Johnie Flaming A, NP   lidocaine  (LIDODERM ) 5 % Place 1 patch onto the skin daily. Remove & Discard patch within 12 hours or as directed by MD 30 patch Johnie Flaming LABOR, NP      PDMP not reviewed this encounter.    [1]  Social History Tobacco Use   Smoking status: Never   Smokeless tobacco: Never  Vaping Use   Vaping status: Never Used  Substance Use Topics   Alcohol use: Yes    Comment: rare   Drug use: Not Currently    Frequency: 7.0 times per week    Types: Marijuana    Comment: daily     Johnie Rumaldo LABOR, NP 12/14/24 1752  "
